# Patient Record
Sex: Male | Born: 1950
Health system: Southern US, Community
[De-identification: ages and names within clinical notes are randomized; demographics above are authoritative.]

## PROBLEM LIST (undated history)

## (undated) DIAGNOSIS — G5601 Carpal tunnel syndrome, right upper limb: Secondary | ICD-10-CM

## (undated) DIAGNOSIS — K219 Gastro-esophageal reflux disease without esophagitis: Secondary | ICD-10-CM

## (undated) DIAGNOSIS — K589 Irritable bowel syndrome without diarrhea: Secondary | ICD-10-CM

## (undated) DIAGNOSIS — I10 Essential (primary) hypertension: Secondary | ICD-10-CM

## (undated) DIAGNOSIS — G4730 Sleep apnea, unspecified: Secondary | ICD-10-CM

## (undated) DIAGNOSIS — H6690 Otitis media, unspecified, unspecified ear: Secondary | ICD-10-CM

## (undated) DIAGNOSIS — M1990 Unspecified osteoarthritis, unspecified site: Secondary | ICD-10-CM

## (undated) DIAGNOSIS — M797 Fibromyalgia: Secondary | ICD-10-CM

## (undated) DIAGNOSIS — E785 Hyperlipidemia, unspecified: Secondary | ICD-10-CM

## (undated) DIAGNOSIS — J302 Other seasonal allergic rhinitis: Secondary | ICD-10-CM

## (undated) DIAGNOSIS — E039 Hypothyroidism, unspecified: Secondary | ICD-10-CM

## (undated) DIAGNOSIS — R51 Headache: Secondary | ICD-10-CM

## (undated) HISTORY — PX: VASECTOMY: SHX75

## (undated) HISTORY — PX: KNEE ARTHROSCOPY: SUR90

## (undated) HISTORY — DX: Sleep apnea, unspecified: G47.30

## (undated) HISTORY — PX: CHOLECYSTECTOMY: SHX55

## (undated) HISTORY — DX: Hyperlipidemia, unspecified: E78.5

## (undated) HISTORY — PX: NASAL SEPTUM SURGERY: SHX37

---

## 2003-03-31 ENCOUNTER — Inpatient Hospital Stay (HOSPITAL_COMMUNITY): Admission: EM | Admit: 2003-03-31 | Discharge: 2003-04-05 | Payer: Self-pay | Admitting: *Deleted

## 2003-03-31 ENCOUNTER — Encounter: Payer: Self-pay | Admitting: Emergency Medicine

## 2004-04-16 IMAGING — CT CT CHEST W/ CM
1 of 2 series · 16 of 30 positions shown, 20 images · IV contrast (150 ML OMNI 300)
Comparison: none

CLINICAL DATA: Chest pain.  
 CHEST WITH CONTRAST
TECHNIQUE: Multidetector helical imaging was carried out utilizing PE protocol.  This consists of thin section helical imaging through the chest during high volume contrast infusion (150 cc Omnipaque 300).
 No pulmonary emboli are identified.  CT cannot reliably exclude small peripheral emboli, but the main pulmonary arteries, and their first and second order divisions are free of thrombi.    
 No mediastinal adenopathy.  No pleural or pericardial fluid.
 The lungs are clear except for some subsegmental atelectasis at the right base.  Minimal scarring at the left base.  No acute appearing air space disease.  
 No evidence for aortic dissection.
 IMPRESSION
 1.  No evidence for pulmonary embolism.
 2.  Lungs clear except for subsegmental atelectasis at the right base.

[Series 2: don't forget off/on recon (date) · axial · 0.70mm/px · z∈[-223,-28]mm · 16 of 176 slices shown, 20 images]
[im 10/176  mediastinal]
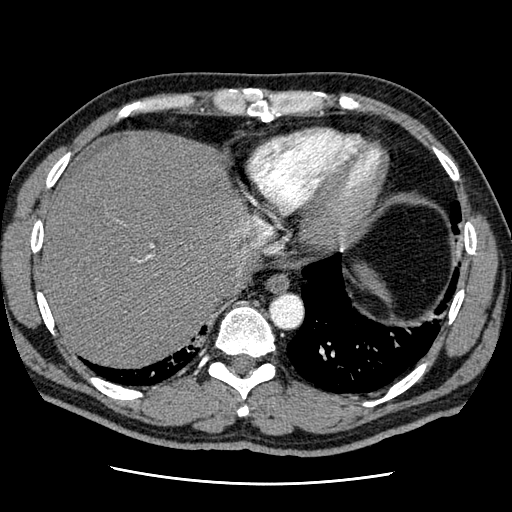
[im 10/176  lung]
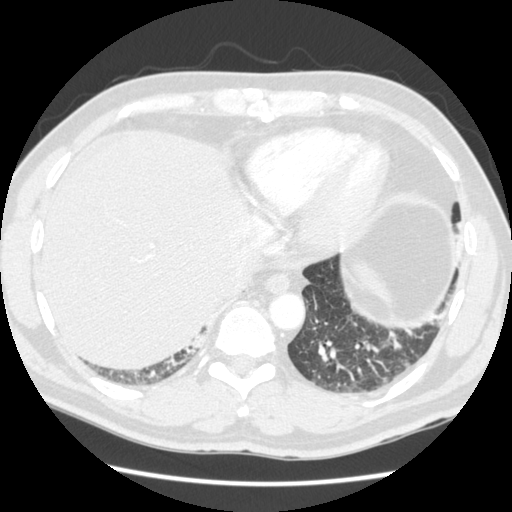
[im 20/176  lung]
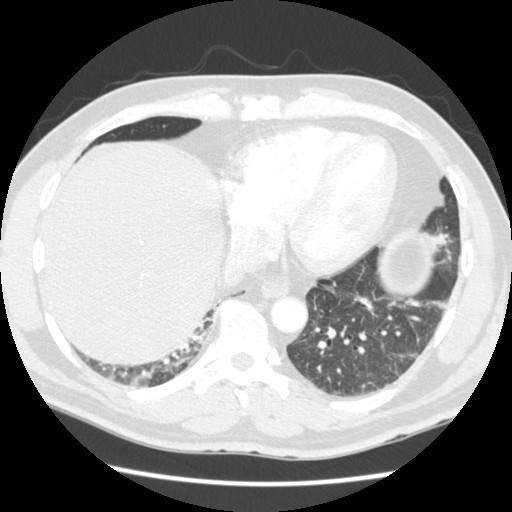
[im 30/176  lung]
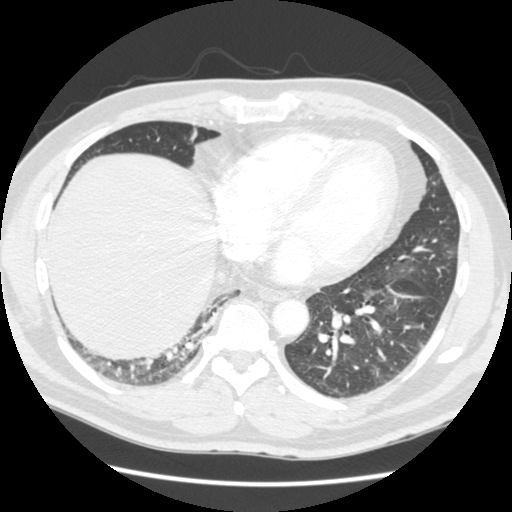
[im 39/176  lung]
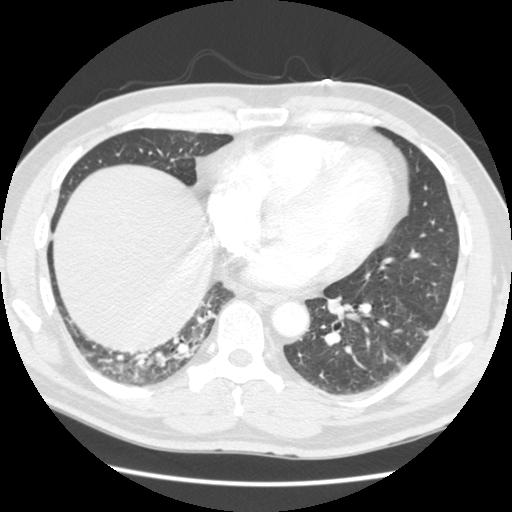
[im 59/176  mediastinal]
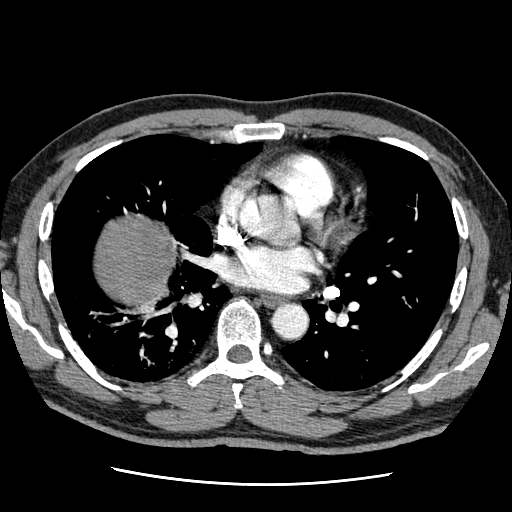
[im 59/176  lung]
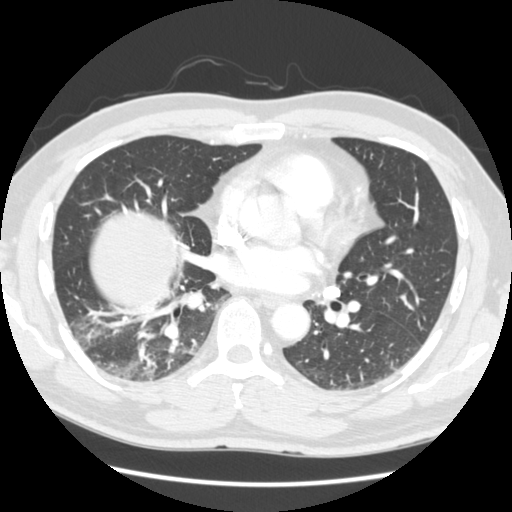
[im 69/176  lung]
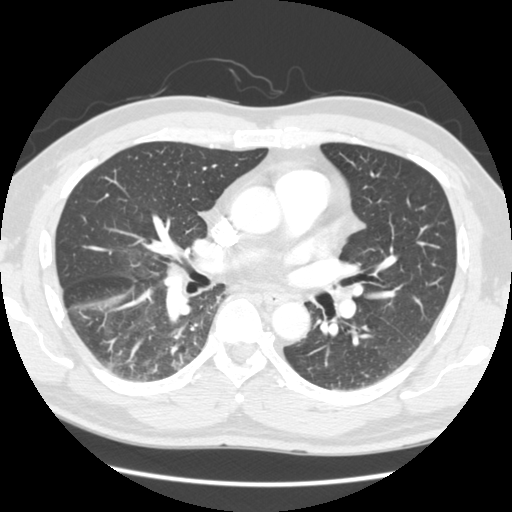
[im 78/176  lung]
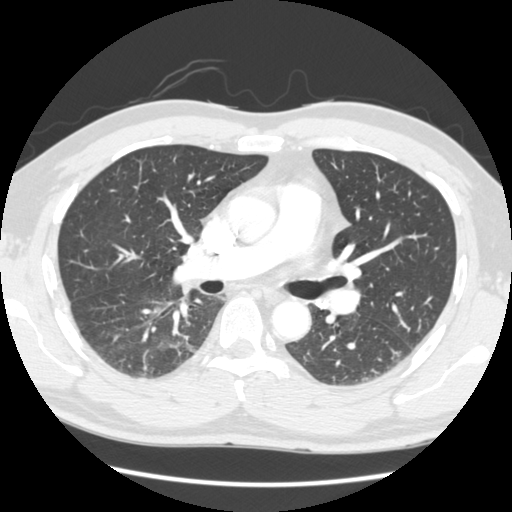
[im 81/176  lung]
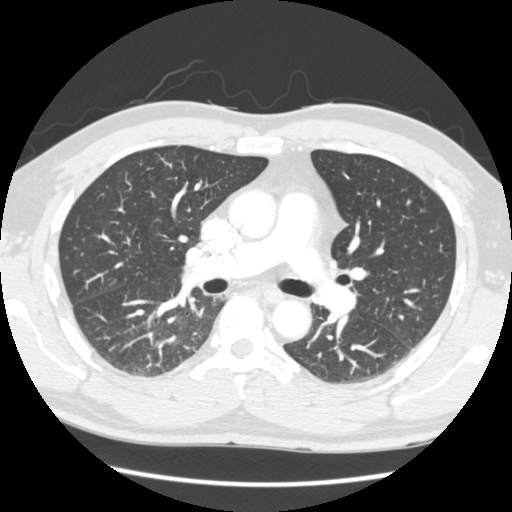
[im 88/176  mediastinal]
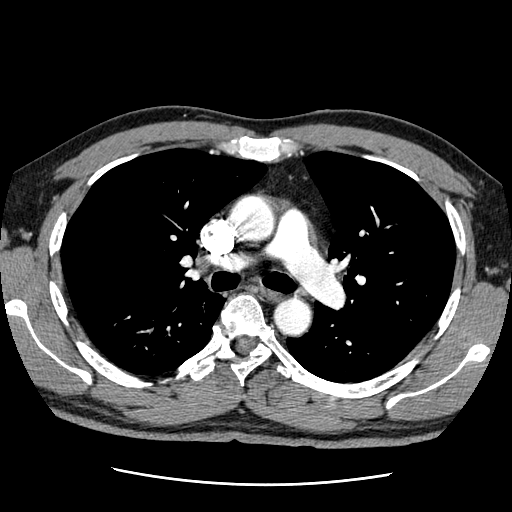
[im 88/176  lung]
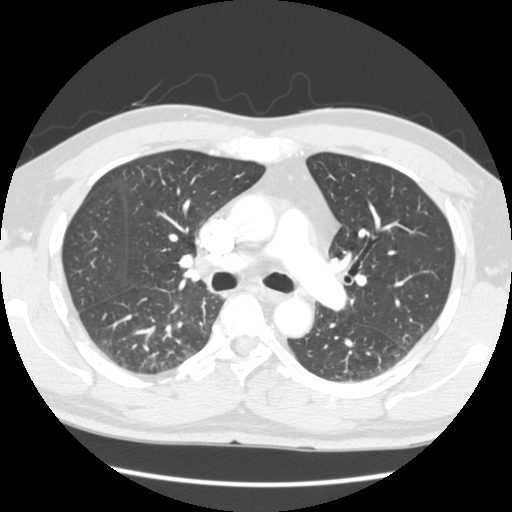
[im 98/176  lung]
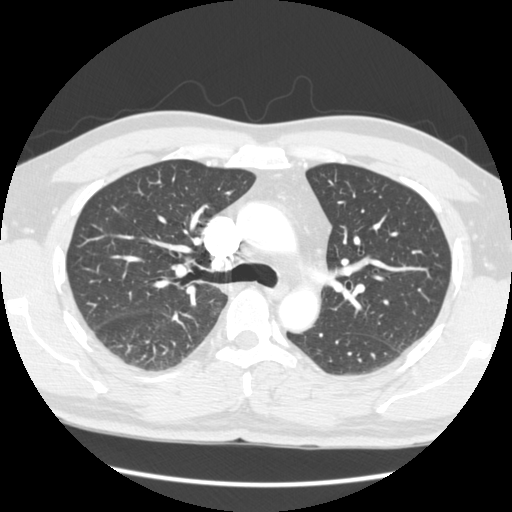
[im 107/176  lung]
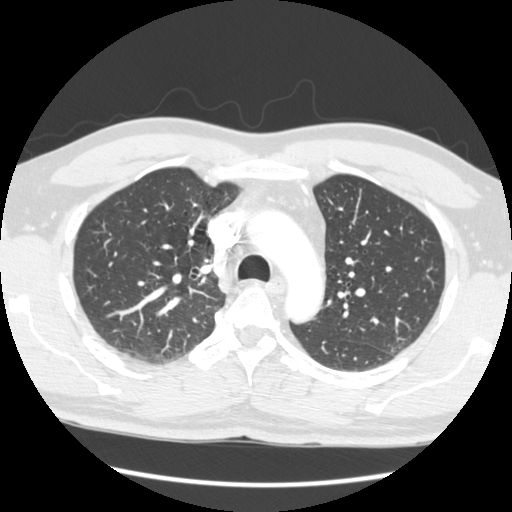
[im 117/176  lung]
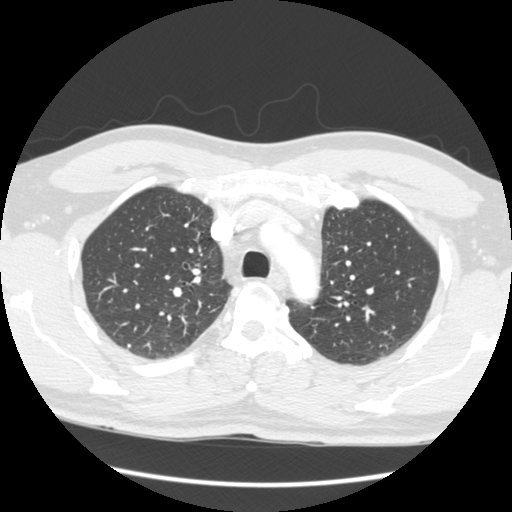
[im 137/176  mediastinal]
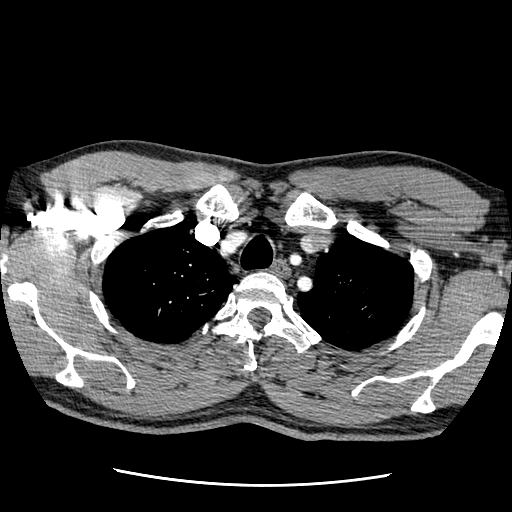
[im 137/176  lung]
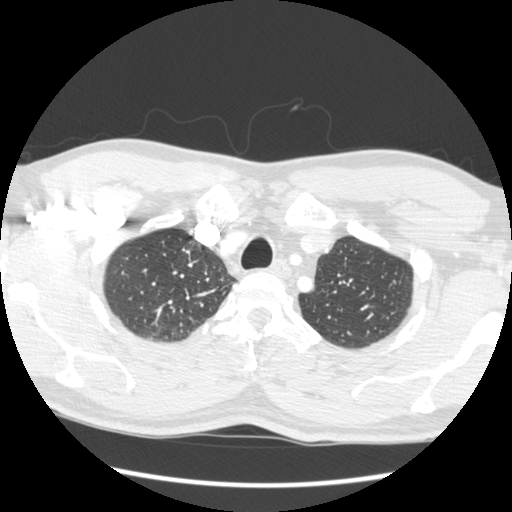
[im 146/176  lung]
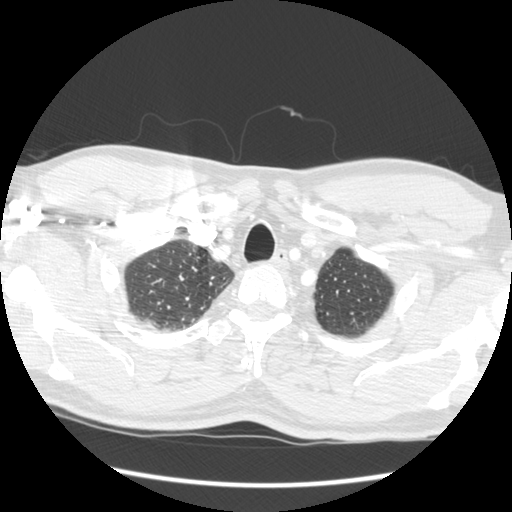
[im 156/176  lung]
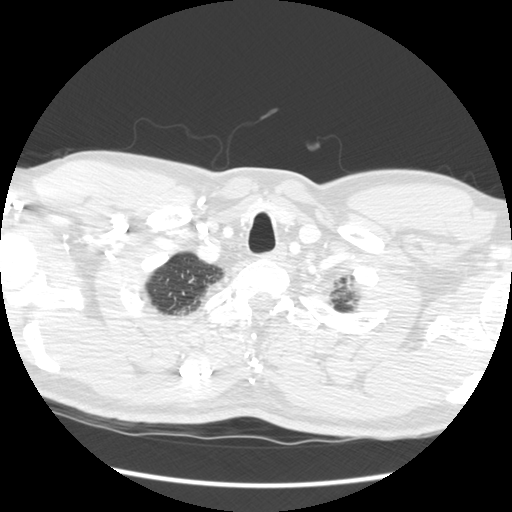
[im 166/176  lung]
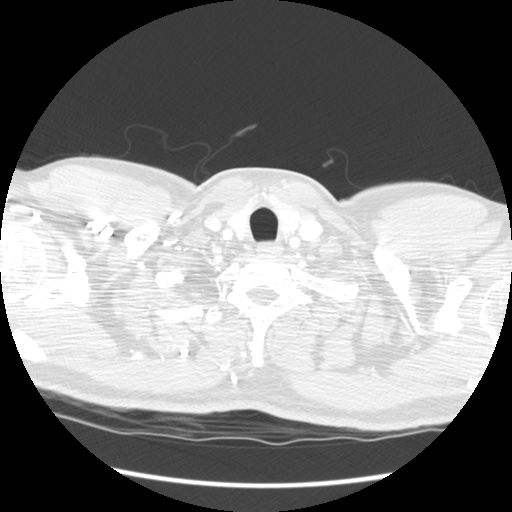

[16 of 30 positions shown; findings below may reference images not displayed]

## 2007-05-25 HISTORY — PX: CARDIAC CATHETERIZATION: SHX172

## 2011-01-08 ENCOUNTER — Ambulatory Visit (INDEPENDENT_AMBULATORY_CARE_PROVIDER_SITE_OTHER): Payer: BC Managed Care – PPO | Admitting: General Surgery

## 2011-01-08 ENCOUNTER — Encounter (INDEPENDENT_AMBULATORY_CARE_PROVIDER_SITE_OTHER): Payer: Self-pay | Admitting: General Surgery

## 2011-01-08 VITALS — BP 144/88 | HR 100 | Temp 97.9°F | Ht 70.0 in | Wt 227.6 lb

## 2011-01-08 DIAGNOSIS — K644 Residual hemorrhoidal skin tags: Secondary | ICD-10-CM

## 2011-01-08 NOTE — Progress Notes (Signed)
Subjective:   Bleeding hemorrhoid  Patient ID: [name], male   DOB: February 04, 1951, 60 y.o.   MRN: [id number]  HPI Patient is a 60 year old male who developed a swollen external hemorrhoid about 4 weeks ago. It has not been very painful. However, for about 2 weeks he has had daily bleeding that occasionally is very significant. He was seen by his PCP and referred for evaluation. He has not had a colonoscopy. He has had previous thrombosed external hemorrhoids lanced in the past.  Review of Systems  Respiratory: Negative.   Cardiovascular: Negative.   Gastrointestinal: Positive for blood in stool. Negative for abdominal pain and rectal pain.  Musculoskeletal: Positive for myalgias.       Objective:   Physical Exam General: Well-developed in no distress Skin: Warm and dry Lungs: Clear without increased work of breathing Cardiovascular regular rate and rhythm. No edema. Rectal: There is a large approximately 2-3 cm right posterior thrombosed hemorrhoid with some overlying skin breakdown and draining old blood. There is a very small adjacent noninflamed thrombosed hemorrhoid.   Assessment:     Large thrombosed external hemorrhoid that has been present for several weeks and now has some skin breakdown and ongoing bleeding.    Plan:     We discussed alternatives of continued medication versus excision. I think he would have a much quicker recovery with excision and this is what he desired.  Under local anesthesia with Betadine prep the entire thrombosed hemorrhoid was elliptically excised. The wound was closed with a 3-0 chromic suture. Complete hemostasis was obtained.  He was given a prescription for topical anesthetic and pain medication. Wound care was discussed. He will return as needed.

## 2011-01-08 NOTE — Patient Instructions (Signed)
Clean the area several times per day in the tub or shower. Expect some bleeding. Call for any concerns.

## 2012-05-24 HISTORY — PX: COLONOSCOPY: SHX174

## 2012-09-29 ENCOUNTER — Other Ambulatory Visit: Payer: Self-pay | Admitting: Family Medicine

## 2012-09-29 ENCOUNTER — Ambulatory Visit
Admission: RE | Admit: 2012-09-29 | Discharge: 2012-09-29 | Disposition: A | Discharge status: Home / Self Care | Payer: BC Managed Care – PPO | Source: Ambulatory Visit | Attending: Family Medicine | Admitting: Family Medicine

## 2012-09-29 DIAGNOSIS — M25532 Pain in left wrist: Secondary | ICD-10-CM

## 2012-11-14 ENCOUNTER — Other Ambulatory Visit: Payer: Self-pay | Admitting: Orthopedic Surgery

## 2012-11-21 ENCOUNTER — Encounter (HOSPITAL_BASED_OUTPATIENT_CLINIC_OR_DEPARTMENT_OTHER): Payer: Self-pay | Admitting: *Deleted

## 2012-11-21 NOTE — Pre-Procedure Instructions (Signed)
To come for BMET and EKG 

## 2012-11-27 ENCOUNTER — Encounter (HOSPITAL_BASED_OUTPATIENT_CLINIC_OR_DEPARTMENT_OTHER)
Admission: RE | Admit: 2012-11-27 | Discharge: 2012-11-27 | Disposition: A | Discharge status: Home / Self Care | Payer: BC Managed Care – PPO | Source: Ambulatory Visit | Attending: Orthopedic Surgery | Admitting: Orthopedic Surgery

## 2012-11-27 ENCOUNTER — Other Ambulatory Visit: Payer: Self-pay

## 2012-11-27 LAB — BASIC METABOLIC PANEL
BUN: 15 mg/dL (ref 6–23)
Calcium: 9.2 mg/dL (ref 8.4–10.5)
GFR calc non Af Amer: 88 mL/min — ABNORMAL LOW (ref >90)
Glucose, Bld: 105 mg/dL — ABNORMAL HIGH (ref 70–99)
Potassium: 4.2 mEq/L (ref 3.5–5.1)

## 2012-11-27 NOTE — H&P (Signed)
  [name] is an 62 y.o. male.   Chief Complaint: c/o chronic and progressive numbness and tingling of the left hand HPI: [name] is seen for a consult regarding his significant hand discomfort and numbness. He has numbness in his hands when he drives. He awakens at night typically with numbness in the median innervated fingers. He now seeks an upper extremity orthopaedic consult.    Past Medical History  Diagnosis Date  . Hyperlipidemia   . Sleep apnea   . Seasonal allergies   . Irritable bowel syndrome     no current med.  . Fibromyalgia   . Carpal tunnel syndrome of left wrist 11/2012  . Hypertension     under control with meds., has been on med. since 2008  . Headache(784.0)     sinus headaches; also dull headache almost every day, states related to fibromyalgia    Past Surgical History  Procedure Laterality Date  . Cholecystectomy    . Knee arthroscopy Bilateral   . Nasal septum surgery    . Vasectomy      Family History  Problem Relation Age of Onset  . Cancer Mother     bladder  . Hypertension Mother   . Heart disease Father   . Hypertension Sister   . Heart disease Sister    Social History:  reports that he has quit smoking. He has never used smokeless tobacco. He reports that he does not drink alcohol or use illicit drugs.  Allergies: No Known Allergies  No prescriptions prior to admission    No results found for this or any previous visit (from the past 48 hour(s)).  No results found.   Pertinent items are noted in HPI.  Height 5\' 10"  (1.778 m), weight 104.327 kg (230 lb).  General appearance: alert Head: Normocephalic, without obvious abnormality Neck: supple, symmetrical, trachea midline Resp: clear to auscultation bilaterally Cardio: regular rate and rhythm GI: normal findings: bowel sounds normal Extremities:. Inspection of his hands reveals no muscle atrophy or deformity. He has wrist ROM palmar flexion right 80, left 80,  dorsiflexion right 80, left 80, symmetrical radial and ulnar deviation. He has a positive wrist flexion test at 1 minute reproducing numbness in his hands, left before right. His pulses and capillary refill are intact. He has no sign of stenosing tenosynovitis of his fingers, thumb or first dorsal compartment.   He had x-rays of his left hand and wrist obtained at [facility] on 09-29-12. These were interpreted by Dr. Ova Freshwater to reveal no fracture or acute bony abnormalities.  He is referred to see Dr. Wadie Lessen for electrodiagnostic studies. These document bilateral carpal tunnel syndrome left worse than right.   Pulses: 2+ and symmetric Skin: normal Neurologic: Grossly normal    Assessment/Plan Impression: Left CTS  Plan: To the OR for left CTR.The procedure, risks,benefits and post-op course were discussed with the patient at length and they were in agreement with the plan.  [name],Geffrey Michaelsen J 11/27/2012, 1:32 PM   H&P documentation: 11/28/2012  -History and Physical Reviewed  -Patient has been re-examined  -No change in the plan of care  Wyn Forster, MD

## 2012-11-28 ENCOUNTER — Ambulatory Visit (HOSPITAL_BASED_OUTPATIENT_CLINIC_OR_DEPARTMENT_OTHER): Payer: BC Managed Care – PPO | Admitting: Anesthesiology

## 2012-11-28 ENCOUNTER — Encounter (HOSPITAL_BASED_OUTPATIENT_CLINIC_OR_DEPARTMENT_OTHER): Payer: Self-pay | Admitting: *Deleted

## 2012-11-28 ENCOUNTER — Encounter (HOSPITAL_BASED_OUTPATIENT_CLINIC_OR_DEPARTMENT_OTHER)
Admission: RE | Disposition: A | Discharge status: Home / Self Care | Payer: Self-pay | Source: Ambulatory Visit | Attending: Orthopedic Surgery

## 2012-11-28 ENCOUNTER — Encounter (HOSPITAL_BASED_OUTPATIENT_CLINIC_OR_DEPARTMENT_OTHER): Payer: Self-pay | Admitting: Anesthesiology

## 2012-11-28 ENCOUNTER — Ambulatory Visit (HOSPITAL_BASED_OUTPATIENT_CLINIC_OR_DEPARTMENT_OTHER)
Admission: RE | Admit: 2012-11-28 | Discharge: 2012-11-28 | Disposition: A | Discharge status: Home / Self Care | Payer: BC Managed Care – PPO | Source: Ambulatory Visit | Attending: Orthopedic Surgery | Admitting: Orthopedic Surgery

## 2012-11-28 DIAGNOSIS — I10 Essential (primary) hypertension: Secondary | ICD-10-CM | POA: Insufficient documentation

## 2012-11-28 DIAGNOSIS — G4730 Sleep apnea, unspecified: Secondary | ICD-10-CM | POA: Insufficient documentation

## 2012-11-28 DIAGNOSIS — IMO0001 Reserved for inherently not codable concepts without codable children: Secondary | ICD-10-CM | POA: Insufficient documentation

## 2012-11-28 DIAGNOSIS — Z87891 Personal history of nicotine dependence: Secondary | ICD-10-CM | POA: Insufficient documentation

## 2012-11-28 DIAGNOSIS — G5600 Carpal tunnel syndrome, unspecified upper limb: Secondary | ICD-10-CM | POA: Insufficient documentation

## 2012-11-28 DIAGNOSIS — J309 Allergic rhinitis, unspecified: Secondary | ICD-10-CM | POA: Insufficient documentation

## 2012-11-28 DIAGNOSIS — E785 Hyperlipidemia, unspecified: Secondary | ICD-10-CM | POA: Insufficient documentation

## 2012-11-28 HISTORY — DX: Headache: R51

## 2012-11-28 HISTORY — PX: CARPAL TUNNEL RELEASE: SHX101

## 2012-11-28 HISTORY — DX: Irritable bowel syndrome without diarrhea: K58.9

## 2012-11-28 HISTORY — DX: Fibromyalgia: M79.7

## 2012-11-28 HISTORY — DX: Essential (primary) hypertension: I10

## 2012-11-28 HISTORY — DX: Other seasonal allergic rhinitis: J30.2

## 2012-11-28 SURGERY — CARPAL TUNNEL RELEASE
Anesthesia: General | Site: Wrist | Laterality: Left | Wound class: Clean

## 2012-11-28 MED ORDER — MIDAZOLAM HCL 2 MG/ML PO SYRP: 12.0000 mg | ORAL_SOLUTION | Freq: Once | ORAL | Status: DC | PRN

## 2012-11-28 MED ORDER — OXYCODONE HCL 5 MG PO TABS
5.0000 mg | ORAL_TABLET | Freq: Once | ORAL | Status: AC | PRN
  Administered 2012-11-28: 5 mg via ORAL

## 2012-11-28 MED ORDER — LIDOCAINE HCL 2 % IJ SOLN
INTRAMUSCULAR | Status: DC | PRN
  Administered 2012-11-28: 3 mL

## 2012-11-28 MED ORDER — METOCLOPRAMIDE HCL 5 MG/ML IJ SOLN: 10.0000 mg | Freq: Once | INTRAMUSCULAR | Status: DC | PRN

## 2012-11-28 MED ORDER — CHLORHEXIDINE GLUCONATE 4 % EX LIQD: 60.0000 mL | Freq: Once | CUTANEOUS | Status: DC

## 2012-11-28 MED ORDER — MIDAZOLAM HCL 5 MG/5ML IJ SOLN
INTRAMUSCULAR | Status: DC | PRN
  Administered 2012-11-28: 2 mg via INTRAVENOUS

## 2012-11-28 MED ORDER — PROPOFOL 10 MG/ML IV BOLUS
INTRAVENOUS | Status: DC | PRN
  Administered 2012-11-28: 200 mg via INTRAVENOUS

## 2012-11-28 MED ORDER — FENTANYL CITRATE 0.05 MG/ML IJ SOLN: 50.0000 ug | INTRAMUSCULAR | Status: DC | PRN

## 2012-11-28 MED ORDER — FENTANYL CITRATE 0.05 MG/ML IJ SOLN: 25.0000 ug | INTRAMUSCULAR | Status: DC | PRN

## 2012-11-28 MED ORDER — MIDAZOLAM HCL 2 MG/2ML IJ SOLN: 1.0000 mg | INTRAMUSCULAR | Status: DC | PRN

## 2012-11-28 MED ORDER — ONDANSETRON HCL 4 MG/2ML IJ SOLN
INTRAMUSCULAR | Status: DC | PRN
  Administered 2012-11-28: 4 mg via INTRAVENOUS

## 2012-11-28 MED ORDER — OXYCODONE HCL 5 MG/5ML PO SOLN: 5.0000 mg | Freq: Once | ORAL | Status: AC | PRN

## 2012-11-28 MED ORDER — FENTANYL CITRATE 0.05 MG/ML IJ SOLN
INTRAMUSCULAR | Status: DC | PRN
  Administered 2012-11-28: 100 ug via INTRAVENOUS

## 2012-11-28 MED ORDER — LIDOCAINE HCL (CARDIAC) 20 MG/ML IV SOLN
INTRAVENOUS | Status: DC | PRN
  Administered 2012-11-28: 100 mg via INTRAVENOUS

## 2012-11-28 MED ORDER — LACTATED RINGERS IV SOLN
INTRAVENOUS | Status: DC
  Administered 2012-11-28: 08:00:00 via INTRAVENOUS

## 2012-11-28 MED ORDER — DEXAMETHASONE SODIUM PHOSPHATE 4 MG/ML IJ SOLN
INTRAMUSCULAR | Status: DC | PRN
  Administered 2012-11-28: 10 mg via INTRAVENOUS

## 2012-11-28 MED ORDER — HYDROMORPHONE HCL 2 MG PO TABS: 2.0000 mg | ORAL_TABLET | ORAL | Status: DC | PRN

## 2012-11-28 SURGICAL SUPPLY — 41 items
BANDAGE ADHESIVE 1X3 (GAUZE/BANDAGES/DRESSINGS) IMPLANT
BANDAGE ELASTIC 3 VELCRO ST LF (GAUZE/BANDAGES/DRESSINGS) ×2 IMPLANT
BLADE SURG 15 STRL LF DISP TIS (BLADE) ×1 IMPLANT
BLADE SURG 15 STRL SS (BLADE) ×1
BNDG ESMARK 4X9 LF (GAUZE/BANDAGES/DRESSINGS) ×2 IMPLANT
BRUSH SCRUB EZ PLAIN DRY (MISCELLANEOUS) ×2 IMPLANT
CLOTH BEACON ORANGE TIMEOUT ST (SAFETY) ×2 IMPLANT
CORDS BIPOLAR (ELECTRODE) ×2 IMPLANT
COVER MAYO STAND STRL (DRAPES) ×2 IMPLANT
COVER TABLE BACK 60X90 (DRAPES) ×2 IMPLANT
CUFF TOURNIQUET SINGLE 18IN (TOURNIQUET CUFF) ×2 IMPLANT
DECANTER SPIKE VIAL GLASS SM (MISCELLANEOUS) IMPLANT
DRAPE EXTREMITY T 121X128X90 (DRAPE) ×2 IMPLANT
DRAPE SURG 17X23 STRL (DRAPES) ×2 IMPLANT
GLOVE BIO SURGEON STRL SZ 6.5 (GLOVE) ×2 IMPLANT
GLOVE BIOGEL M STRL SZ7.5 (GLOVE) IMPLANT
GLOVE BIOGEL PI IND STRL 6.5 (GLOVE) ×1 IMPLANT
GLOVE BIOGEL PI INDICATOR 6.5 (GLOVE) ×1
GLOVE EXAM NITRILE EXT CUFF MD (GLOVE) ×2 IMPLANT
GLOVE ORTHO TXT STRL SZ7.5 (GLOVE) ×2 IMPLANT
GLOVE SURG SS PI 7.0 STRL IVOR (GLOVE) ×2 IMPLANT
GOWN BRE IMP PREV XXLGXLNG (GOWN DISPOSABLE) ×2 IMPLANT
GOWN PREVENTION PLUS XLARGE (GOWN DISPOSABLE) ×4 IMPLANT
NDL SAFETY ECLIPSE 18X1.5 (NEEDLE) ×1 IMPLANT
NEEDLE 27GAX1X1/2 (NEEDLE) ×2 IMPLANT
NEEDLE HYPO 18GX1.5 SHARP (NEEDLE) ×1
PACK BASIN DAY SURGERY FS (CUSTOM PROCEDURE TRAY) ×2 IMPLANT
PAD CAST 3X4 CTTN HI CHSV (CAST SUPPLIES) ×1 IMPLANT
PADDING CAST ABS 4INX4YD NS (CAST SUPPLIES)
PADDING CAST ABS COTTON 4X4 ST (CAST SUPPLIES) IMPLANT
PADDING CAST COTTON 3X4 STRL (CAST SUPPLIES) ×1
SPLINT PLASTER CAST XFAST 3X15 (CAST SUPPLIES) ×5 IMPLANT
SPLINT PLASTER XTRA FASTSET 3X (CAST SUPPLIES) ×5
SPONGE GAUZE 4X4 12PLY (GAUZE/BANDAGES/DRESSINGS) IMPLANT
STOCKINETTE 4X48 STRL (DRAPES) ×2 IMPLANT
STRIP CLOSURE SKIN 1/2X4 (GAUZE/BANDAGES/DRESSINGS) ×2 IMPLANT
SUT PROLENE 3 0 PS 2 (SUTURE) ×2 IMPLANT
SYR 3ML 23GX1 SAFETY (SYRINGE) IMPLANT
SYR CONTROL 10ML LL (SYRINGE) ×2 IMPLANT
TRAY DSU PREP LF (CUSTOM PROCEDURE TRAY) ×2 IMPLANT
UNDERPAD 30X30 INCONTINENT (UNDERPADS AND DIAPERS) ×2 IMPLANT

## 2012-11-28 NOTE — Anesthesia Preprocedure Evaluation (Signed)
Anesthesia Evaluation  Patient identified by MRN, date of birth, ID band Patient awake    Reviewed: Allergy & Precautions, H&P , NPO status , Patient's Chart, lab work & pertinent test results, reviewed documented beta blocker date and time   Airway Mallampati: II TM Distance: >3 FB Neck ROM: full    Dental   Pulmonary sleep apnea and Continuous Positive Airway Pressure Ventilation ,  breath sounds clear to auscultation        Cardiovascular hypertension, On Medications Rhythm:regular     Neuro/Psych  Headaches,  Neuromuscular disease negative psych ROS   GI/Hepatic negative GI ROS, Neg liver ROS,   Endo/Other  negative endocrine ROS  Renal/GU negative Renal ROS  negative genitourinary   Musculoskeletal   Abdominal   Peds  Hematology negative hematology ROS (+)   Anesthesia Other Findings See surgeon's H&P   Reproductive/Obstetrics negative OB ROS                           Anesthesia Physical Anesthesia Plan  ASA: II  Anesthesia Plan: General   Post-op Pain Management:    Induction: Intravenous  Airway Management Planned: LMA  Additional Equipment:   Intra-op Plan:   Post-operative Plan:   Informed Consent: I have reviewed the patients History and Physical, chart, labs and discussed the procedure including the risks, benefits and alternatives for the proposed anesthesia with the patient or authorized representative who has indicated his/her understanding and acceptance.   Dental Advisory Given  Plan Discussed with: CRNA and Surgeon  Anesthesia Plan Comments:         Anesthesia Quick Evaluation

## 2012-11-28 NOTE — Brief Op Note (Signed)
11/28/2012  8:59 AM  PATIENT:  [name]  62 y.o. male  PRE-OPERATIVE DIAGNOSIS:  LEFT CARPAL TUNNEL SYNDROME  POST-OPERATIVE DIAGNOSIS:  LEFT CARPAL TUNNEL SYNDROME  PROCEDURE:  Procedure(s): CARPAL TUNNEL RELEASE (Left)  SURGEON:  Surgeon(s) and Role:    * Wyn Forster., MD - Primary  PHYSICIAN ASSISTANT:   ASSISTANTS: Surgical technician   ANESTHESIA:   general  EBL:  Total I/O In: 200 [I.V.:200] Out: -   BLOOD ADMINISTERED:none  DRAINS: none   LOCAL MEDICATIONS USED:  XYLOCAINE   SPECIMEN:  No Specimen  DISPOSITION OF SPECIMEN:  N/A  COUNTS:  YES  TOURNIQUET:   Total Tourniquet Time Documented: Upper Arm (Left) - 11 minutes Total: Upper Arm (Left) - 11 minutes   DICTATION: .Other Dictation: Dictation Number dictated  PLAN OF CARE: Discharge to home after PACU  PATIENT DISPOSITION:  PACU - hemodynamically stable.   Delay start of Pharmacological VTE agent (>24hrs) due to surgical blood loss or risk of bleeding: not applicable

## 2012-11-28 NOTE — Anesthesia Procedure Notes (Signed)
Procedure Name: LMA Insertion Date/Time: 11/28/2012 8:35 AM Performed by: Verlan Friends Pre-anesthesia Checklist: Patient identified, Emergency Drugs available, Suction available, Patient being monitored and Timeout performed Patient Re-evaluated:Patient Re-evaluated prior to inductionOxygen Delivery Method: Circle System Utilized Preoxygenation: Pre-oxygenation with 100% oxygen Intubation Type: IV induction Ventilation: Mask ventilation without difficulty LMA: LMA inserted LMA Size: 5.0 Number of attempts: 1 Placement Confirmation: positive ETCO2 Tube secured with: Tape Dental Injury: Teeth and Oropharynx as per pre-operative assessment

## 2012-11-28 NOTE — Op Note (Signed)
[name], ROUTE              ACCOUNT NO.:  [id number]  MEDICAL RECORD NO.:  [id number]  LOCATION:                               FACILITY:  [facility]  PHYSICIAN:  Katy Fitch. Lynk Marti, M.D. DATE OF BIRTH:  1950/11/30  DATE OF PROCEDURE:  11/28/2012 DATE OF DISCHARGE:  11/28/2012                              OPERATIVE REPORT   PREOPERATIVE DIAGNOSIS:  Chronic pain left hand due to median entrapment neuropathy.  POSTOPERATIVE DIAGNOSIS:  Chronic pain left hand due to median entrapment neuropathy.  OPERATION:  Release of left transverse carpal ligament.  OPERATING SURGEON:  Katy Fitch. Sanford Lindblad, MD  ASSISTANT:  Surgical technician.  ANESTHESIA:  General by LMA.  SUPERVISING ANESTHESIOLOGIST:  Dr. Gelene Mink.  INDICATIONS:  [name] is a 62 year old gentleman referred through the courtesy of Dr. Tally Joe of [facility] for evaluation and management of bilateral hand pain and numbness.  Clinical examination revealed a gentleman with chronic pain issues including fibromyalgia and hand numbness.  Clinical examination suggested bilateral carpal tunnel syndrome and electrodiagnostic studies confirmed bilateral carpal tunnel syndrome.  Mr. Mahon' numbness did not respond to splinting, activity modification, and steroid injection, therefore, we recommended proceeding with release of left transverse carpal ligament.  After informed consent, he was brought to the operating room at this time.  PROCEDURE:  [name] was interviewed in the holding area by Dr. Justin Mend and after detailed anesthesia informed consent was advised to undergo general anesthesia by LMA technique.  This was accepted by [name].  His proper surgical site was identified.  His hand examined to be sure it did not have signs of stenosing tenosynovitis in addition to his carpal tunnel syndrome.  His left hand and wrist were marked with a marking pen per protocol.  He was then  transferred to room 6 of [facility], where under Dr. Thornton Dales direct supervision, general anesthesia by LMA technique was induced.  The left hand and arm were then prepped with Betadine soap and solution, sterilely draped.  A pneumatic tourniquet was applied to the proximal left brachium.  Following a routine surgical time-out, the left arm and hand were exsanguinated with Esmarch bandage and the arterial tourniquet inflated to 220 mmHg.  Procedure commenced with a short incision in the line of the ring finger and the palm.  Subcutaneous tissues were carefully divided revealing the palmar fascia.  This was split longitudinally to reveal the common sensory branch of the median nerve.  The common sensory branches were followed back to the transverse carpal ligament and the median nerve proper separated from the transverse carpal ligament with a Penfield 4 Engineer, structural.  The transverse carpal ligament was released along its ulnar border extending into the distal forearm.  This widely opened the carpal canal. No mass or other predicaments were noted.  Bleeding points along the margin of the released ligament were electrocauterized with bipolar forceps.  Carpal canal was widely opened.  The ulnar bursa was thickened and fibrotic.  No masses or other predicaments were noted.  Bleeding points along the margin of the released ligament were electrocauterized with bipolar current followed by repair of the skin with intradermal 3-0 Prolene suture.  A  Steri-Strips applied followed by infiltration of wound with 3 mL of 2% lidocaine for postoperative comfort.  The hand and wrist were then placed in a compressive dressing of sterile gauze, sterile Webril, and a volar plaster splint maintaining the wrist in 15 degrees of dorsiflexion.  For aftercare, Mr. Jost was provided prescription for Dilaudid 2 mg 1 p.o. q.4-6 hours p.r.n. pain, 20 tablets without refill.     Katy Fitch  Robin Petrakis, M.D.     RVS/MEDQ  D:  11/28/2012  T:  11/28/2012  Job:  [id number]

## 2012-11-28 NOTE — Transfer of Care (Signed)
Immediate Anesthesia Transfer of Care Note  Patient: [name]  Procedure(s) Performed: Procedure(s): CARPAL TUNNEL RELEASE (Left)  Patient Location: PACU  Anesthesia Type:General  Level of Consciousness: sedated and unresponsive  Airway & Oxygen Therapy: Patient Spontanous Breathing and Patient connected to face mask oxygen  Post-op Assessment: Report given to PACU RN and Post -op Vital signs reviewed and stable  Post vital signs: Reviewed and stable  Complications: No apparent anesthesia complications

## 2012-11-28 NOTE — Op Note (Signed)
dictated

## 2012-11-28 NOTE — Anesthesia Postprocedure Evaluation (Signed)
Anesthesia Post Note  Patient: [name]  Procedure(s) Performed: Procedure(s) (LRB): CARPAL TUNNEL RELEASE (Left)  Anesthesia type: MAC  Patient location: PACU  Post pain: Pain level controlled  Post assessment: Patient's Cardiovascular Status Stable  Last Vitals:  Filed Vitals:   11/28/12 0945  BP: 145/82  Pulse: 73  Temp:   Resp: 12    Post vital signs: Reviewed and stable  Level of consciousness: alert  Complications: No apparent anesthesia complications

## 2012-11-29 ENCOUNTER — Encounter (HOSPITAL_BASED_OUTPATIENT_CLINIC_OR_DEPARTMENT_OTHER): Payer: Self-pay | Admitting: Orthopedic Surgery

## 2012-12-22 DIAGNOSIS — G5601 Carpal tunnel syndrome, right upper limb: Secondary | ICD-10-CM

## 2012-12-22 HISTORY — DX: Carpal tunnel syndrome, right upper limb: G56.01

## 2012-12-25 DIAGNOSIS — H6690 Otitis media, unspecified, unspecified ear: Secondary | ICD-10-CM

## 2012-12-25 HISTORY — DX: Otitis media, unspecified, unspecified ear: H66.90

## 2012-12-26 ENCOUNTER — Other Ambulatory Visit: Payer: Self-pay | Admitting: Orthopedic Surgery

## 2012-12-26 ENCOUNTER — Encounter (HOSPITAL_BASED_OUTPATIENT_CLINIC_OR_DEPARTMENT_OTHER): Payer: Self-pay | Admitting: *Deleted

## 2012-12-26 NOTE — Pre-Procedure Instructions (Signed)
To come for BMET 

## 2013-01-01 ENCOUNTER — Encounter (HOSPITAL_BASED_OUTPATIENT_CLINIC_OR_DEPARTMENT_OTHER)
Admission: RE | Admit: 2013-01-01 | Discharge: 2013-01-01 | Disposition: A | Discharge status: Home / Self Care | Payer: BC Managed Care – PPO | Source: Ambulatory Visit | Attending: Orthopedic Surgery | Admitting: Orthopedic Surgery

## 2013-01-01 LAB — BASIC METABOLIC PANEL
BUN: 11 mg/dL (ref 6–23)
CO2: 29 mEq/L (ref 19–32)
Chloride: 101 mEq/L (ref 96–112)
GFR calc non Af Amer: 79 mL/min — ABNORMAL LOW (ref >90)
Glucose, Bld: 96 mg/dL (ref 70–99)
Potassium: 4.5 mEq/L (ref 3.5–5.1)
Sodium: 141 mEq/L (ref 135–145)

## 2013-01-01 NOTE — H&P (Signed)
[name] is an 62 y.o. male.   Chief Complaint: c/o chronic and progressive numbness and tingling of the right hand HPI: [name] is seen for a consult regarding his significant hand discomfort and numbness. He has sleep apnea and chronic Fibromyalgia. He has seen multiple rheumatologists including Drs. Rowe, Zieminski and [employer]. He has been on medication for Fibromyalgia for multiple years. He continues to have enthesopathy type pains. He has significant difficulty sleeping despite use of a CPAP machine. With pain medication and a CPAP he sleeps 3-4 hours maximum. He has numbness in his hands when he drives. He awakens at night typically with numbness in the median innervated fingers. He now seeks an upper extremity orthopaedic consult.   Past Medical History  Diagnosis Date  . Hyperlipidemia   . Seasonal allergies   . Irritable bowel syndrome     no current med.  . Hypertension     under control with meds., has been on med. since 2008  . Headache(784.0)     sinus headaches; also dull headache almost every day, states related to fibromyalgia  . Sleep apnea     uses CPAP nightly  . Fibromyalgia   . Carpal tunnel syndrome of right wrist 12/2012  . Otitis media 12/25/2012    started antibiotic 12/25/2012 x 14 days    Past Surgical History  Procedure Laterality Date  . Cholecystectomy    . Knee arthroscopy Bilateral   . Nasal septum surgery    . Vasectomy    . Carpal tunnel release Left 11/28/2012    Procedure: CARPAL TUNNEL RELEASE;  Surgeon: Wyn Forster., MD;  Location: [facility];  Service: Orthopedics;  Laterality: Left;    Family History  Problem Relation Age of Onset  . Cancer Mother     bladder  . Hypertension Mother   . Heart disease Father   . Hypertension Sister   . Heart disease Sister    Social History:  reports that he has quit smoking. He has never used smokeless tobacco. He reports that he does not drink alcohol or use illicit  drugs.  Allergies: No Known Allergies  No prescriptions prior to admission    No results found for this or any previous visit (from the past 48 hour(s)).  No results found.   Pertinent items are noted in HPI.  Height 5\' 10"  (1.778 m), weight 105.235 kg (232 lb).  General appearance: alert Head: Normocephalic, without obvious abnormality Neck: supple, symmetrical, trachea midline Resp: clear to auscultation bilaterally Cardio: regular rate and rhythm GI: normal findings: bowel sounds normal Extremities: Inspection of his hands reveals no muscle atrophy or deformity. He has wrist ROM palmar flexion right 80, left 80, dorsiflexion right 80, left 80, symmetrical radial and ulnar deviation. He has a positive wrist flexion test at 1 minute reproducing numbness in his hands, left before right. His pulses and capillary refill are intact. He has no sign of stenosing tenosynovitis of his fingers, thumb or first dorsal compartment.   He had x-rays of his left hand and wrist obtained at [facility] on 09-29-12. These were interpreted by Dr. Ova Freshwater to reveal no fracture or acute bony abnormalities.  He is referred to see Dr.Sam Pelligra for electrodiagnostic studies. These document bilateral carpal tunnel syndrome left worse than right.     Pulses: 2+ and symmetric Skin: normal Neurologic: Grossly normal   Assessment/Plan Impression: Right CTS  Plan: TO the OR for right CTR.The procedure, risks,benefits and post-op  course were discussed with the patient at length and they were in agreement with the plan.  [name],[name] 01/01/2013, 2:19 PM   H&P documentation: 01/02/2013  -History and Physical Reviewed  -Patient has been re-examined  -No change in the plan of care  Wyn Forster, MD

## 2013-01-02 ENCOUNTER — Ambulatory Visit (HOSPITAL_BASED_OUTPATIENT_CLINIC_OR_DEPARTMENT_OTHER): Payer: BC Managed Care – PPO | Admitting: Anesthesiology

## 2013-01-02 ENCOUNTER — Encounter (HOSPITAL_BASED_OUTPATIENT_CLINIC_OR_DEPARTMENT_OTHER): Payer: Self-pay | Admitting: *Deleted

## 2013-01-02 ENCOUNTER — Encounter (HOSPITAL_BASED_OUTPATIENT_CLINIC_OR_DEPARTMENT_OTHER): Payer: Self-pay | Admitting: Anesthesiology

## 2013-01-02 ENCOUNTER — Encounter (HOSPITAL_BASED_OUTPATIENT_CLINIC_OR_DEPARTMENT_OTHER)
Admission: RE | Disposition: A | Discharge status: Home / Self Care | Payer: Self-pay | Source: Ambulatory Visit | Attending: Orthopedic Surgery

## 2013-01-02 ENCOUNTER — Ambulatory Visit (HOSPITAL_BASED_OUTPATIENT_CLINIC_OR_DEPARTMENT_OTHER)
Admission: RE | Admit: 2013-01-02 | Discharge: 2013-01-02 | Disposition: A | Discharge status: Home / Self Care | Payer: BC Managed Care – PPO | Source: Ambulatory Visit | Attending: Orthopedic Surgery | Admitting: Orthopedic Surgery

## 2013-01-02 DIAGNOSIS — IMO0001 Reserved for inherently not codable concepts without codable children: Secondary | ICD-10-CM | POA: Insufficient documentation

## 2013-01-02 DIAGNOSIS — G4730 Sleep apnea, unspecified: Secondary | ICD-10-CM | POA: Insufficient documentation

## 2013-01-02 DIAGNOSIS — K589 Irritable bowel syndrome without diarrhea: Secondary | ICD-10-CM | POA: Insufficient documentation

## 2013-01-02 DIAGNOSIS — I10 Essential (primary) hypertension: Secondary | ICD-10-CM | POA: Insufficient documentation

## 2013-01-02 DIAGNOSIS — Z79899 Other long term (current) drug therapy: Secondary | ICD-10-CM | POA: Insufficient documentation

## 2013-01-02 DIAGNOSIS — G5600 Carpal tunnel syndrome, unspecified upper limb: Secondary | ICD-10-CM | POA: Insufficient documentation

## 2013-01-02 DIAGNOSIS — Z87891 Personal history of nicotine dependence: Secondary | ICD-10-CM | POA: Insufficient documentation

## 2013-01-02 DIAGNOSIS — J309 Allergic rhinitis, unspecified: Secondary | ICD-10-CM | POA: Insufficient documentation

## 2013-01-02 DIAGNOSIS — E785 Hyperlipidemia, unspecified: Secondary | ICD-10-CM | POA: Insufficient documentation

## 2013-01-02 HISTORY — DX: Carpal tunnel syndrome, right upper limb: G56.01

## 2013-01-02 HISTORY — PX: CARPAL TUNNEL RELEASE: SHX101

## 2013-01-02 HISTORY — DX: Otitis media, unspecified, unspecified ear: H66.90

## 2013-01-02 LAB — POCT HEMOGLOBIN-HEMACUE: Hemoglobin: 15.5 g/dL (ref 13.0–17.0)

## 2013-01-02 SURGERY — CARPAL TUNNEL RELEASE
Anesthesia: General | Site: Wrist | Laterality: Right | Wound class: Clean

## 2013-01-02 MED ORDER — LACTATED RINGERS IV SOLN
INTRAVENOUS | Status: DC
  Administered 2013-01-02: 10:00:00 via INTRAVENOUS

## 2013-01-02 MED ORDER — FENTANYL CITRATE 0.05 MG/ML IJ SOLN: 50.0000 ug | INTRAMUSCULAR | Status: DC | PRN

## 2013-01-02 MED ORDER — ONDANSETRON HCL 4 MG/2ML IJ SOLN: 4.0000 mg | Freq: Four times a day (QID) | INTRAMUSCULAR | Status: DC | PRN

## 2013-01-02 MED ORDER — OXYCODONE-ACETAMINOPHEN 5-325 MG PO TABS: ORAL_TABLET | ORAL | Status: DC

## 2013-01-02 MED ORDER — OXYCODONE HCL 5 MG PO TABS: 5.0000 mg | ORAL_TABLET | Freq: Once | ORAL | Status: DC | PRN

## 2013-01-02 MED ORDER — MIDAZOLAM HCL 5 MG/5ML IJ SOLN
INTRAMUSCULAR | Status: DC | PRN
  Administered 2013-01-02: 2 mg via INTRAVENOUS

## 2013-01-02 MED ORDER — FENTANYL CITRATE 0.05 MG/ML IJ SOLN: 25.0000 ug | INTRAMUSCULAR | Status: DC | PRN

## 2013-01-02 MED ORDER — MIDAZOLAM HCL 2 MG/ML PO SYRP: 12.0000 mg | ORAL_SOLUTION | Freq: Once | ORAL | Status: DC | PRN

## 2013-01-02 MED ORDER — ONDANSETRON HCL 4 MG/2ML IJ SOLN
INTRAMUSCULAR | Status: DC | PRN
  Administered 2013-01-02: 4 mg via INTRAVENOUS

## 2013-01-02 MED ORDER — CHLORHEXIDINE GLUCONATE 4 % EX LIQD: 60.0000 mL | Freq: Once | CUTANEOUS | Status: DC

## 2013-01-02 MED ORDER — OXYCODONE HCL 5 MG/5ML PO SOLN: 5.0000 mg | Freq: Once | ORAL | Status: DC | PRN

## 2013-01-02 MED ORDER — DEXAMETHASONE SODIUM PHOSPHATE 10 MG/ML IJ SOLN
INTRAMUSCULAR | Status: DC | PRN
  Administered 2013-01-02: 10 mg via INTRAVENOUS

## 2013-01-02 MED ORDER — MIDAZOLAM HCL 2 MG/2ML IJ SOLN: 1.0000 mg | INTRAMUSCULAR | Status: DC | PRN

## 2013-01-02 MED ORDER — LIDOCAINE HCL 2 % IJ SOLN
INTRAMUSCULAR | Status: DC | PRN
  Administered 2013-01-02: 4.5 mL

## 2013-01-02 MED ORDER — LIDOCAINE HCL (CARDIAC) 20 MG/ML IV SOLN
INTRAVENOUS | Status: DC | PRN
  Administered 2013-01-02: 80 mg via INTRAVENOUS

## 2013-01-02 MED ORDER — FENTANYL CITRATE 0.05 MG/ML IJ SOLN
INTRAMUSCULAR | Status: DC | PRN
  Administered 2013-01-02: 100 ug via INTRAVENOUS

## 2013-01-02 SURGICAL SUPPLY — 37 items
BANDAGE ADHESIVE 1X3 (GAUZE/BANDAGES/DRESSINGS) IMPLANT
BANDAGE ELASTIC 3 VELCRO ST LF (GAUZE/BANDAGES/DRESSINGS) ×2 IMPLANT
BLADE SURG 15 STRL LF DISP TIS (BLADE) ×1 IMPLANT
BLADE SURG 15 STRL SS (BLADE) ×1
BNDG ESMARK 4X9 LF (GAUZE/BANDAGES/DRESSINGS) ×2 IMPLANT
BRUSH SCRUB EZ PLAIN DRY (MISCELLANEOUS) ×2 IMPLANT
CLOTH BEACON ORANGE TIMEOUT ST (SAFETY) ×2 IMPLANT
CORDS BIPOLAR (ELECTRODE) IMPLANT
COVER MAYO STAND STRL (DRAPES) ×2 IMPLANT
COVER TABLE BACK 60X90 (DRAPES) ×2 IMPLANT
CUFF TOURNIQUET SINGLE 18IN (TOURNIQUET CUFF) ×2 IMPLANT
DECANTER SPIKE VIAL GLASS SM (MISCELLANEOUS) IMPLANT
DRAPE EXTREMITY T 121X128X90 (DRAPE) ×2 IMPLANT
DRAPE SURG 17X23 STRL (DRAPES) ×2 IMPLANT
GLOVE BIO SURGEON STRL SZ 6.5 (GLOVE) ×2 IMPLANT
GLOVE BIOGEL M STRL SZ7.5 (GLOVE) ×2 IMPLANT
GLOVE ECLIPSE 6.5 STRL STRAW (GLOVE) ×2 IMPLANT
GLOVE ORTHO TXT STRL SZ7.5 (GLOVE) ×2 IMPLANT
GOWN BRE IMP PREV XXLGXLNG (GOWN DISPOSABLE) ×2 IMPLANT
GOWN PREVENTION PLUS XLARGE (GOWN DISPOSABLE) ×2 IMPLANT
NEEDLE 27GAX1X1/2 (NEEDLE) IMPLANT
PACK BASIN DAY SURGERY FS (CUSTOM PROCEDURE TRAY) ×2 IMPLANT
PAD CAST 3X4 CTTN HI CHSV (CAST SUPPLIES) ×1 IMPLANT
PADDING CAST ABS 4INX4YD NS (CAST SUPPLIES) ×1
PADDING CAST ABS COTTON 4X4 ST (CAST SUPPLIES) ×1 IMPLANT
PADDING CAST COTTON 3X4 STRL (CAST SUPPLIES) ×1
SPLINT PLASTER CAST XFAST 3X15 (CAST SUPPLIES) ×5 IMPLANT
SPLINT PLASTER XTRA FASTSET 3X (CAST SUPPLIES) ×5
SPONGE GAUZE 4X4 12PLY (GAUZE/BANDAGES/DRESSINGS) ×2 IMPLANT
STOCKINETTE 4X48 STRL (DRAPES) ×2 IMPLANT
STRIP CLOSURE SKIN 1/2X4 (GAUZE/BANDAGES/DRESSINGS) ×2 IMPLANT
SUT PROLENE 3 0 PS 2 (SUTURE) ×2 IMPLANT
SYR 3ML 23GX1 SAFETY (SYRINGE) IMPLANT
SYR CONTROL 10ML LL (SYRINGE) IMPLANT
TOWEL OR 17X24 6PK STRL BLUE (TOWEL DISPOSABLE) ×2 IMPLANT
TRAY DSU PREP LF (CUSTOM PROCEDURE TRAY) ×2 IMPLANT
UNDERPAD 30X30 INCONTINENT (UNDERPADS AND DIAPERS) ×2 IMPLANT

## 2013-01-02 NOTE — Anesthesia Preprocedure Evaluation (Signed)
Anesthesia Evaluation  Patient identified by MRN, date of birth, ID band Patient awake    Reviewed: Allergy & Precautions, H&P , NPO status , Patient's Chart, lab work & pertinent test results  Airway Mallampati: II  Neck ROM: full    Dental   Pulmonary sleep apnea , former smoker,          Cardiovascular hypertension,     Neuro/Psych  Headaches,  Neuromuscular disease    GI/Hepatic   Endo/Other  obese  Renal/GU      Musculoskeletal  (+) Fibromyalgia -  Abdominal   Peds  Hematology   Anesthesia Other Findings   Reproductive/Obstetrics                           Anesthesia Physical Anesthesia Plan  ASA: II  Anesthesia Plan: General   Post-op Pain Management:    Induction: Intravenous  Airway Management Planned: LMA  Additional Equipment:   Intra-op Plan:   Post-operative Plan:   Informed Consent: I have reviewed the patients History and Physical, chart, labs and discussed the procedure including the risks, benefits and alternatives for the proposed anesthesia with the patient or authorized representative who has indicated his/her understanding and acceptance.     Plan Discussed with: CRNA, Anesthesiologist and Surgeon  Anesthesia Plan Comments:         Anesthesia Quick Evaluation

## 2013-01-02 NOTE — Anesthesia Postprocedure Evaluation (Signed)
Anesthesia Post Note  Patient: [name]  Procedure(s) Performed: Procedure(s) (LRB): CARPAL TUNNEL RELEASE (Right)  Anesthesia type: General  Patient location: PACU  Post pain: Pain level controlled and Adequate analgesia  Post assessment: Post-op Vital signs reviewed, Patient's Cardiovascular Status Stable, Respiratory Function Stable, Patent Airway and Pain level controlled  Last Vitals:  Filed Vitals:   01/02/13 1100  BP:   Pulse: 80  Temp:   Resp: 17    Post vital signs: Reviewed and stable  Level of consciousness: awake, alert  and oriented  Complications: No apparent anesthesia complications

## 2013-01-02 NOTE — Brief Op Note (Signed)
01/02/2013  10:34 AM  PATIENT:  Arnoldo Hooker  62 y.o. male  PRE-OPERATIVE DIAGNOSIS:  RIGHT CARPAL TUNNEL SYNDROME  POST-OPERATIVE DIAGNOSIS:  RIGHT CARPAL TUNNEL SYNDROME  PROCEDURE:  Procedure(s): CARPAL TUNNEL RELEASE (Right)  SURGEON:  Surgeon(s) and Role:    * Wyn Forster., MD - Primary  PHYSICIAN ASSISTANT:   ASSISTANTS: surgical tech  ANESTHESIA:   general  EBL:  Total I/O In: 700 [I.V.:700] Out: -   BLOOD ADMINISTERED:none  DRAINS: none   LOCAL MEDICATIONS USED:  LIDOCAINE   SPECIMEN:  No Specimen  DISPOSITION OF SPECIMEN:  N/A  COUNTS:  YES  TOURNIQUET:   Total Tourniquet Time Documented: Upper Arm (Right) - [id number] minutes Total: Upper Arm (Right) - 119147 minutes   DICTATION: .Other Dictation: Dictation Number [id number]  PLAN OF CARE: Discharge to home after PACU  PATIENT DISPOSITION:  PACU - hemodynamically stable.   Delay start of Pharmacological VTE agent (>24hrs) due to surgical blood loss or risk of bleeding: not applicable

## 2013-01-02 NOTE — Anesthesia Procedure Notes (Signed)
Procedure Name: LMA Insertion Date/Time: 01/02/2013 10:04 AM Performed by: Burna Cash Pre-anesthesia Checklist: Patient identified, Emergency Drugs available, Suction available and Patient being monitored Patient Re-evaluated:Patient Re-evaluated prior to inductionOxygen Delivery Method: Circle System Utilized Preoxygenation: Pre-oxygenation with 100% oxygen Intubation Type: IV induction Ventilation: Mask ventilation without difficulty LMA: LMA inserted LMA Size: 5.0 Number of attempts: 1 Airway Equipment and Method: bite block Placement Confirmation: positive ETCO2 Tube secured with: Tape Dental Injury: Teeth and Oropharynx as per pre-operative assessment

## 2013-01-02 NOTE — Op Note (Signed)
[id number] 

## 2013-01-02 NOTE — Transfer of Care (Signed)
Immediate Anesthesia Transfer of Care Note  Patient: [name]  Procedure(s) Performed: Procedure(s): CARPAL TUNNEL RELEASE (Right)  Patient Location: PACU  Anesthesia Type:General  Level of Consciousness: sedated  Airway & Oxygen Therapy: Patient Spontanous Breathing and Patient connected to face mask oxygen  Post-op Assessment: Report given to PACU RN and Post -op Vital signs reviewed and stable  Post vital signs: Reviewed and stable  Complications: No apparent anesthesia complications

## 2013-01-03 ENCOUNTER — Encounter (HOSPITAL_BASED_OUTPATIENT_CLINIC_OR_DEPARTMENT_OTHER): Payer: Self-pay | Admitting: Orthopedic Surgery

## 2013-01-03 NOTE — Op Note (Signed)
[name], [name]              ACCOUNT NO.:  [id number]  MEDICAL RECORD NO.:  [id number]  LOCATION:                               FACILITY:  [facility]  PHYSICIAN:  Todd Fitch. Eyanna Mcgonagle, M.D. DATE OF BIRTH:  July 24, 1950  DATE OF PROCEDURE:  01/02/2013 DATE OF DISCHARGE:  01/02/2013                              OPERATIVE REPORT   PREOPERATIVE DIAGNOSIS:  Chronic median entrapment neuropathy, right carpal tunnel.  POSTOPERATIVE DIAGNOSIS:  Chronic median entrapment neuropathy, right carpal tunnel.  OPERATION:  Release of right transverse carpal ligament.  OPERATING SURGEON:  Todd Fitch. Jex Strausbaugh, MD  ASSISTANT:  Surgical technician.  ANESTHESIA:  General by LMA.  SUPERVISING ANESTHESIOLOGIST:  Achille Rich, MD.  INDICATIONS:  [name] is a 62 year old gentleman referred for evaluation and management of bilateral hand numbness.  He has had electrodiagnostic studies confirming bilateral significant carpal tunnel syndrome.  He is status post release of his left transcarpal ligament and now returns for release of the right transverse carpal ligament.  Preoperatively, he was reminded potential risks and benefits of surgery. Questions had been answered in detail.  He had tolerated his postoperative pain medication following the prior surgery.  PROCEDURE IN DETAIL:  [name] was brought to room 2 of the [facility] and placed in supine position on the operating table.  Following the induction of general anesthesia by LMA technique under Dr. Chaney Malling strict supervision, the right hand and arm were prepped with Betadine soap and solution, sterilely draped.  A pneumatic tourniquet was applied to the proximal right brachium.  Following exsanguination of right arm with Esmarch bandage, arterial tourniquet was inflated to 220 mmHg.  Following routine surgical time-out, confirming the proper surgical site and procedure, we proceeded with a short incision in the line of  the ring finger of the palm.  Subcutaneous tissues were carefully divided in the palmar fascia.  This split longitudinally in line of its fibers to the superficial palmar arch and the common sensory branch of the median nerve.  A large sensory branch crossing at the distal margin of the transcarpal ligament was identified and gently preserved throughout the procedure.  The transverse carpal ligament was identified and the canal sounded with a Insurance risk surveyor.  The ligament was sequentially released with scissors along its ulnar border extending into the distal forearm.  This widely opened carpal canal.  No mass or other predicaments were noted.  Bleeding points along the margin of the released ligament were electrocauterized bipolar current followed by repair of the skin with intradermal 3-0 Prolene suture.  No masses or other anatomical predicaments were noted within the canal. The wound was then repaired with intradermal 3-0 Prolene suture.  A compressive dressing was applied with sterile gauze, sterile Webril, and a volar plaster splint maintaining the wrist in 15 degrees dorsiflexion.  For aftercare, [name] was provided a new prescription for oxycodone 5 mg 1 p.o. q.4-6 h. p.r.n. pain, 20 tabs refill.     [name], M.D.     RVS/MEDQ  D:  01/02/2013  T:  01/03/2013  Job:  [id number]

## 2013-01-18 ENCOUNTER — Other Ambulatory Visit: Payer: Self-pay | Admitting: Family Medicine

## 2013-01-18 DIAGNOSIS — R42 Dizziness and giddiness: Secondary | ICD-10-CM

## 2013-01-18 DIAGNOSIS — J309 Allergic rhinitis, unspecified: Secondary | ICD-10-CM

## 2013-01-18 DIAGNOSIS — J3489 Other specified disorders of nose and nasal sinuses: Secondary | ICD-10-CM

## 2013-01-19 ENCOUNTER — Ambulatory Visit
Admission: RE | Admit: 2013-01-19 | Discharge: 2013-01-19 | Disposition: A | Discharge status: Home / Self Care | Payer: BC Managed Care – PPO | Source: Ambulatory Visit | Attending: Family Medicine | Admitting: Family Medicine

## 2013-01-19 DIAGNOSIS — R42 Dizziness and giddiness: Secondary | ICD-10-CM

## 2013-01-19 DIAGNOSIS — J309 Allergic rhinitis, unspecified: Secondary | ICD-10-CM

## 2013-01-19 DIAGNOSIS — J3489 Other specified disorders of nose and nasal sinuses: Secondary | ICD-10-CM

## 2013-02-02 ENCOUNTER — Other Ambulatory Visit: Payer: Self-pay | Admitting: Otolaryngology

## 2013-10-14 IMAGING — CR DG WRIST COMPLETE 3+V*L*
4 series · 4 of 4 positions shown · non-contrast
Comparison: None.

CLINICAL DATA: Dorsal wrist pain extending into the ring and small
fingers.

LEFT WRIST - COMPLETE 3+ VIEW

[view not recorded (1 of 4)]
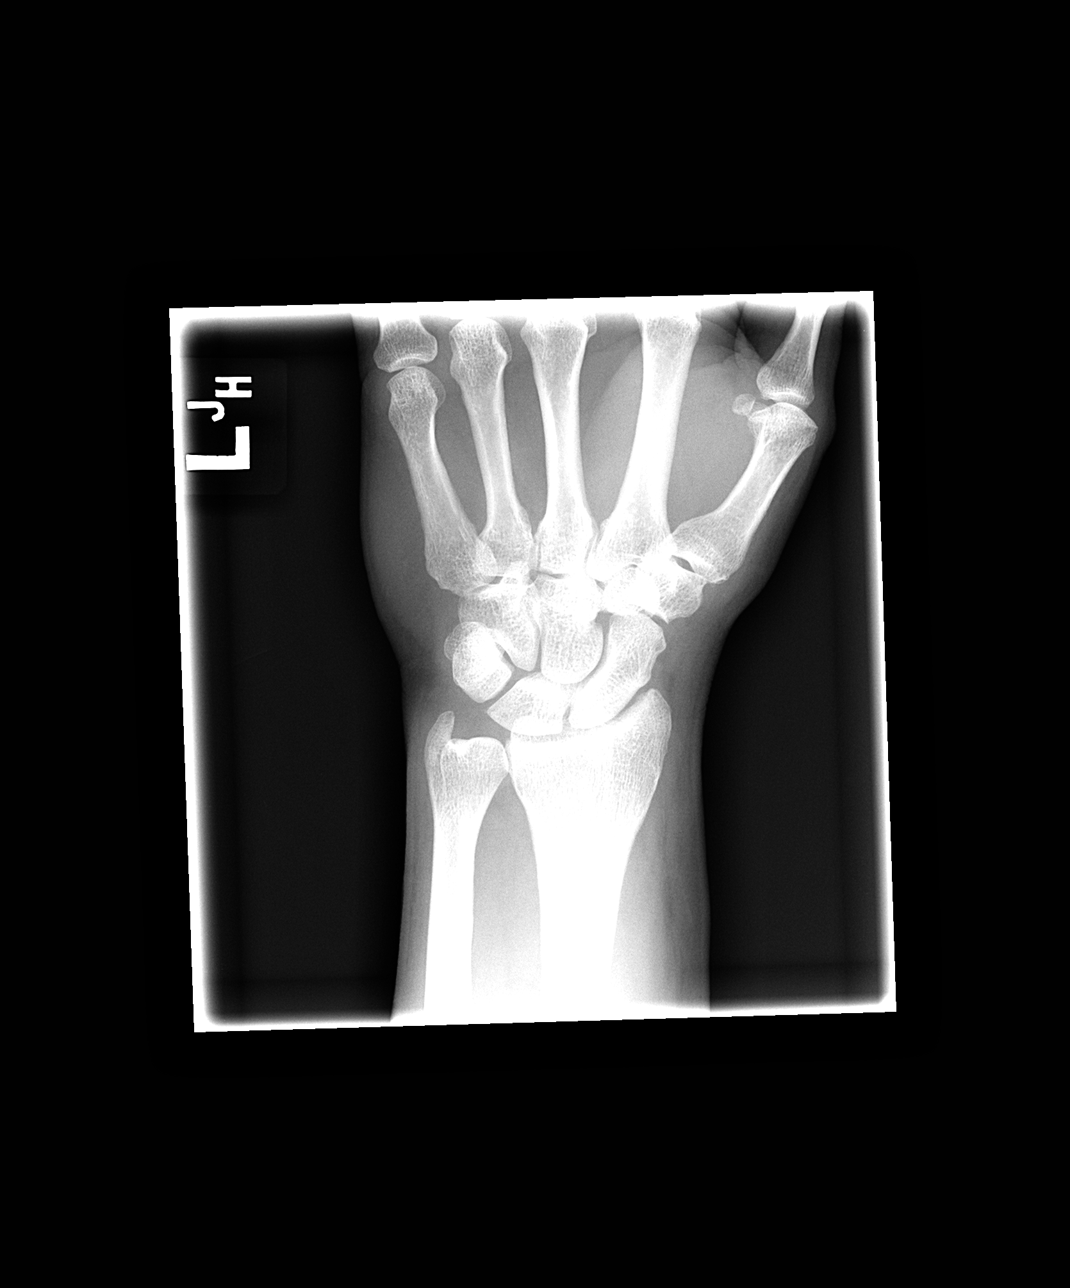

[view not recorded (2 of 4)]
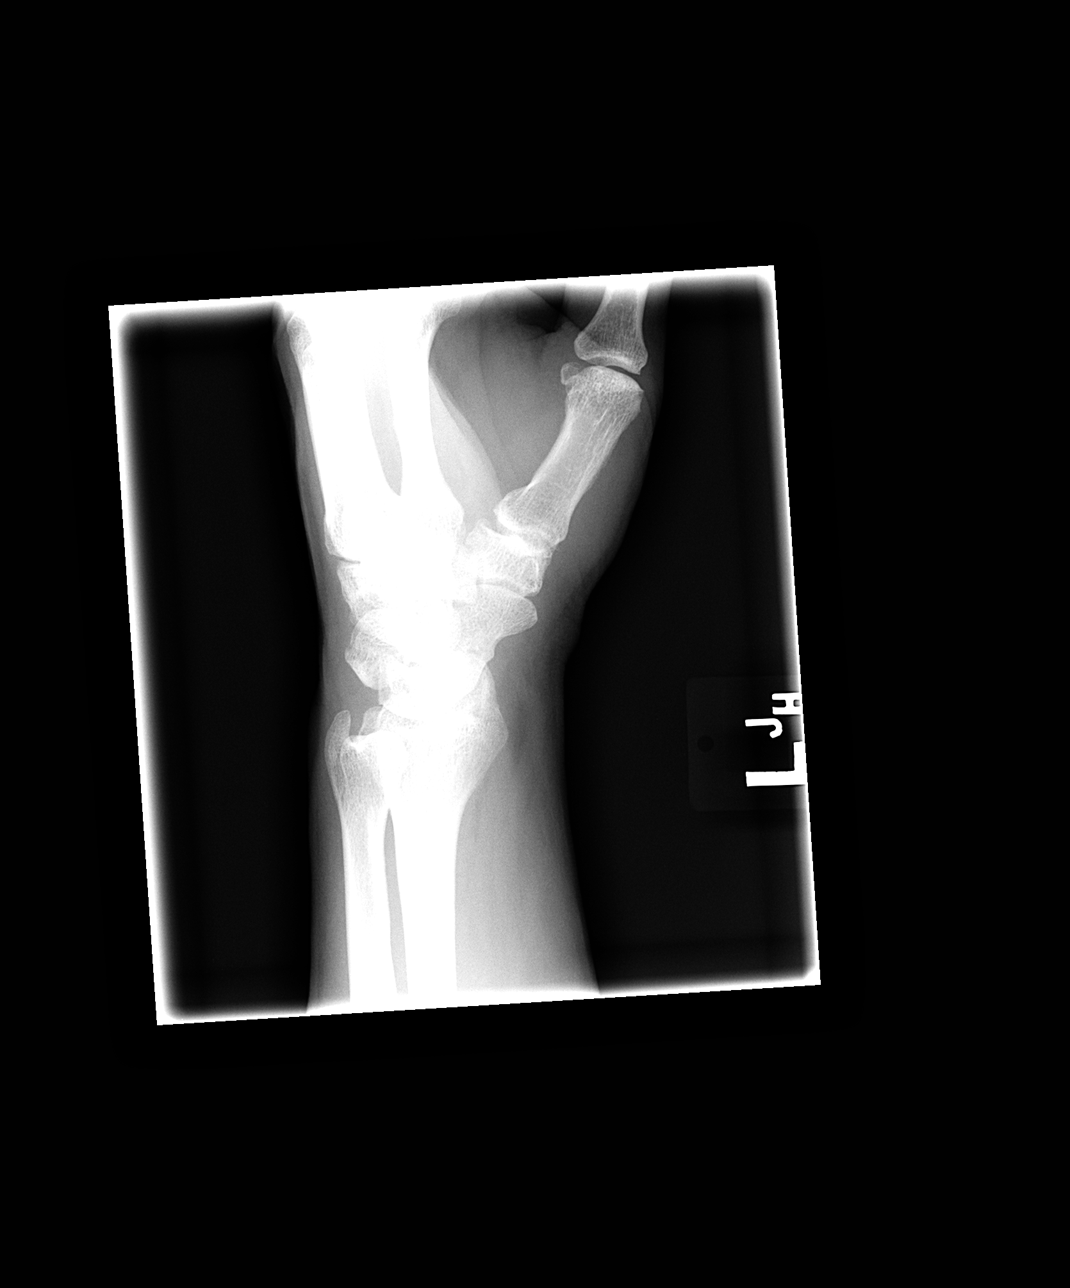

[view not recorded (3 of 4)]
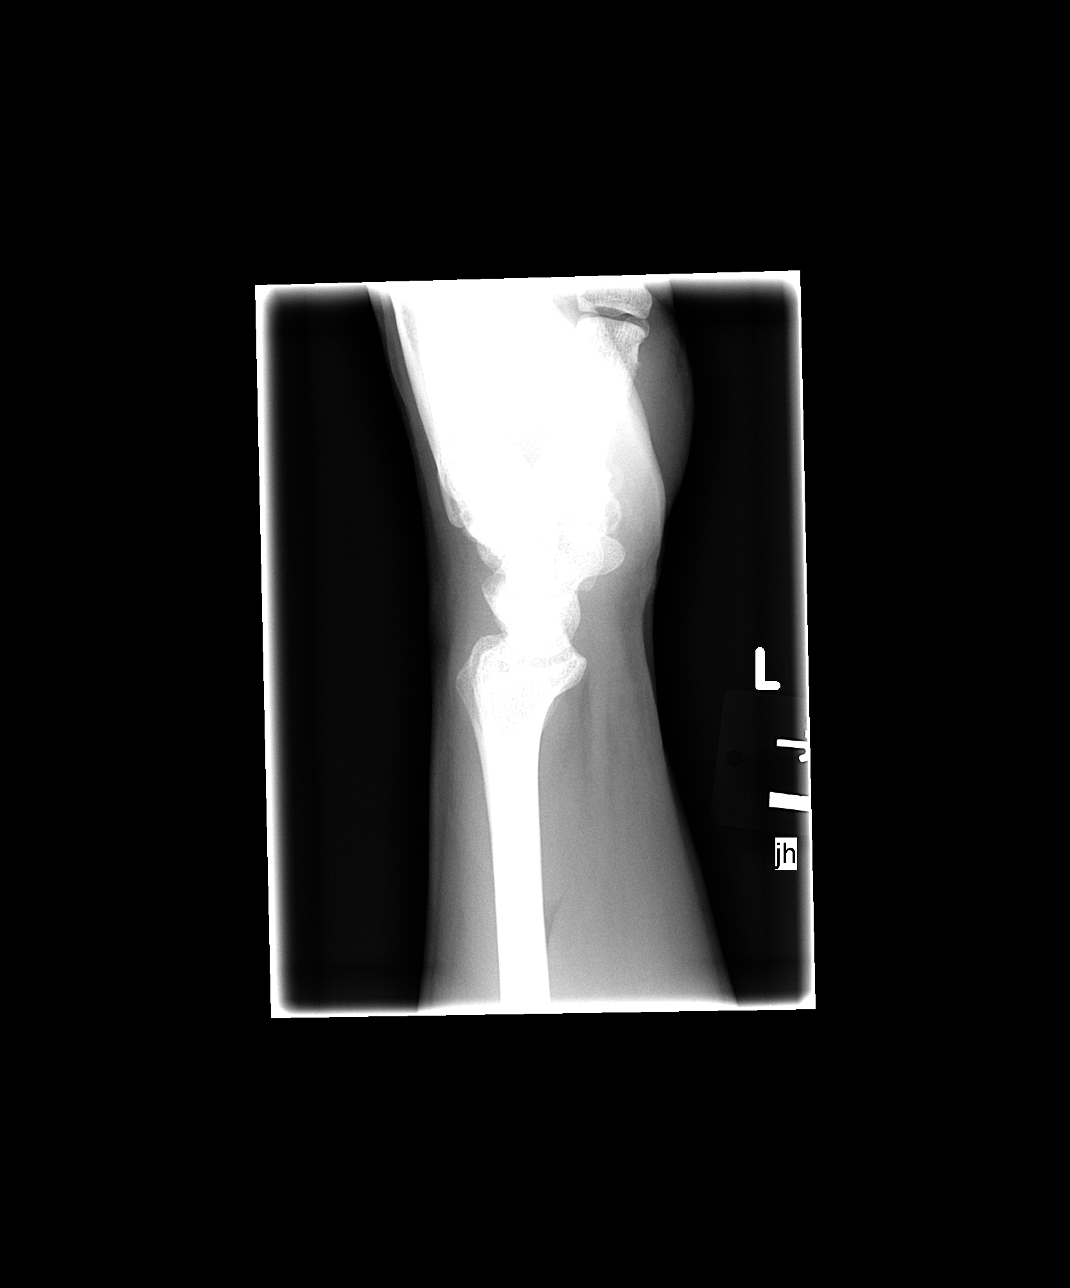

[view not recorded (4 of 4)]
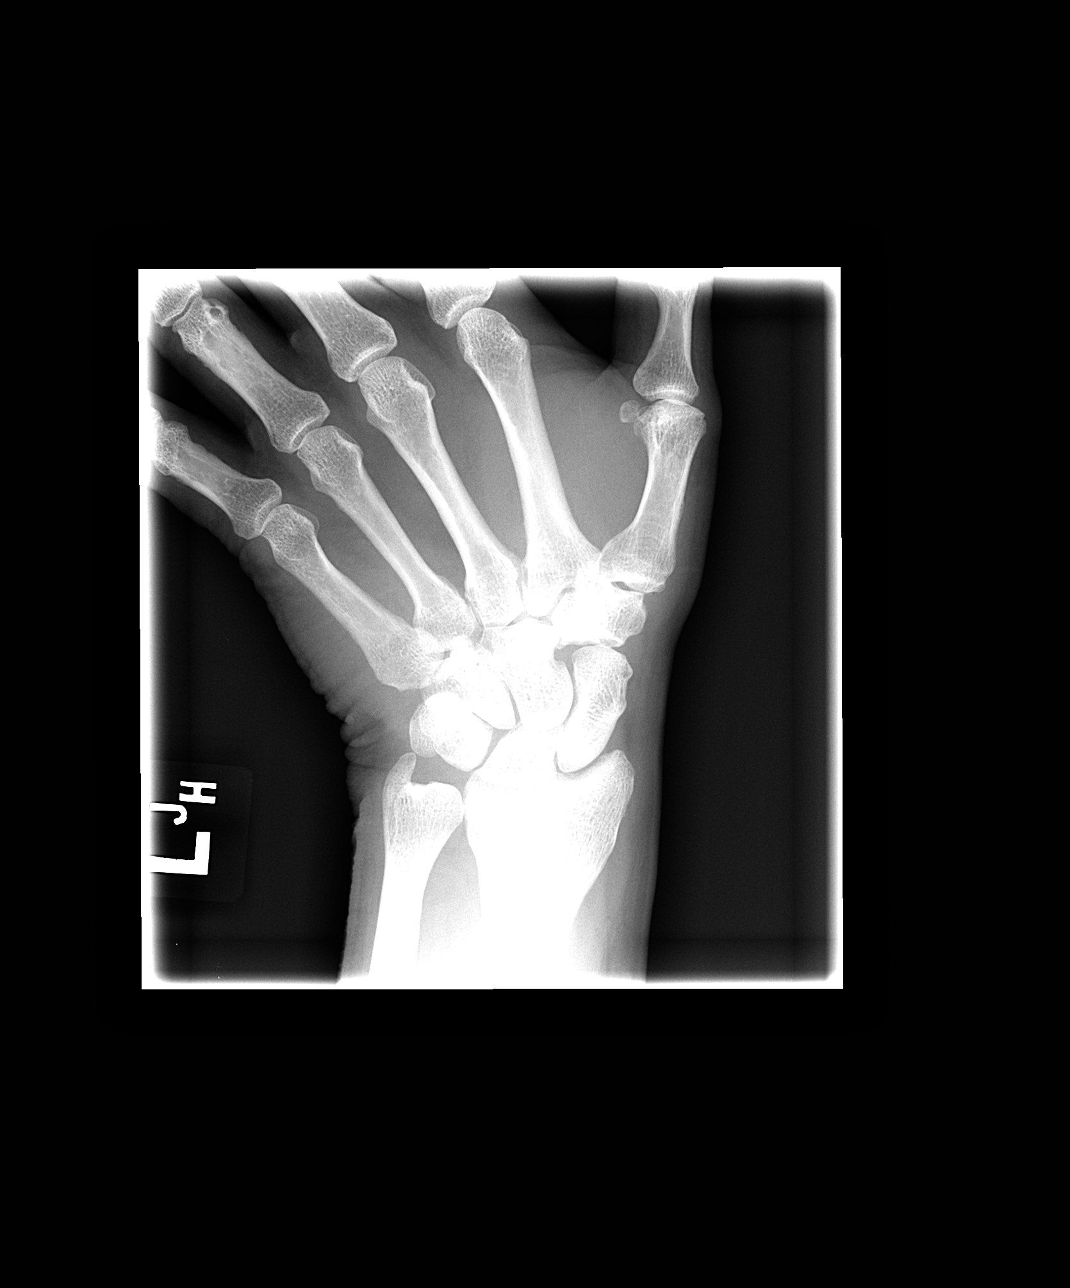

[4 of 4 positions shown; findings below may reference images not displayed]

FINDINGS: No fracture or acute bony finding noted.  No significant
osseous abnormality observed.
IMPRESSION: 1.  No significant abnormality observed. Correlate with potential
sites of the ulnar nerve impingement based on inciting factors in
determining the need for follow-up cross-sectional imaging in the
wrist or elbow.

## 2014-01-21 ENCOUNTER — Ambulatory Visit (INDEPENDENT_AMBULATORY_CARE_PROVIDER_SITE_OTHER): Payer: BC Managed Care – PPO | Admitting: Internal Medicine

## 2014-01-21 ENCOUNTER — Encounter: Payer: Self-pay | Admitting: Internal Medicine

## 2014-01-21 VITALS — BP 140/80 | HR 79 | Ht 70.0 in | Wt 229.8 lb

## 2014-01-21 DIAGNOSIS — R072 Precordial pain: Secondary | ICD-10-CM

## 2014-01-21 DIAGNOSIS — E559 Vitamin D deficiency, unspecified: Secondary | ICD-10-CM

## 2014-01-21 DIAGNOSIS — M797 Fibromyalgia: Secondary | ICD-10-CM | POA: Insufficient documentation

## 2014-01-21 DIAGNOSIS — IMO0001 Reserved for inherently not codable concepts without codable children: Secondary | ICD-10-CM

## 2014-01-21 DIAGNOSIS — R079 Chest pain, unspecified: Secondary | ICD-10-CM

## 2014-01-21 DIAGNOSIS — R7303 Prediabetes: Secondary | ICD-10-CM

## 2014-01-21 DIAGNOSIS — R7309 Other abnormal glucose: Secondary | ICD-10-CM

## 2014-01-21 DIAGNOSIS — R002 Palpitations: Secondary | ICD-10-CM | POA: Diagnosis not present

## 2014-01-21 DIAGNOSIS — I10 Essential (primary) hypertension: Secondary | ICD-10-CM | POA: Diagnosis not present

## 2014-01-21 DIAGNOSIS — E785 Hyperlipidemia, unspecified: Secondary | ICD-10-CM

## 2014-01-21 DIAGNOSIS — Z9989 Dependence on other enabling machines and devices: Secondary | ICD-10-CM

## 2014-01-21 DIAGNOSIS — E669 Obesity, unspecified: Secondary | ICD-10-CM | POA: Insufficient documentation

## 2014-01-21 DIAGNOSIS — G4733 Obstructive sleep apnea (adult) (pediatric): Secondary | ICD-10-CM

## 2014-01-21 DIAGNOSIS — K219 Gastro-esophageal reflux disease without esophagitis: Secondary | ICD-10-CM

## 2014-01-21 NOTE — Patient Instructions (Signed)
Your physician has recommended that you wear an event monitor. Event monitors are medical devices that record the heart's electrical activity. Doctors most often us these monitors to diagnose arrhythmias. Arrhythmias are problems with the speed or rhythm of the heartbeat. The monitor is a small, portable device. You can wear one while you do your normal daily activities. This is usually used to diagnose what is causing palpitations/syncope (passing out). ** you will wear this for 7 days  Dr Hilty has ordered a cardiometabolic test - this is done AT [facility]  What is a Cardiopulmonary Exercise Test (CPET)?   The Cardiopulmonary Exercise Test is a highly sensitive, non-invasive stress test. It is considered a stress test because the exercise stresses your body's systems by making them work faster and harder. A disease or condition that affects the heart, lungs or muscles will limit how much faster and harder these systems can work. A CPET assesses how well the heart, lungs, and muscles are working individually, and how these systems are working in unison. Your heart and lungs work together to deliver oxygen to your muscles, where it is used to make energy, and to remove carbon dioxide from your body.  The full cardiopulmonary system is assessed during a CPET by measuring the amount of oxygen your body is using, the amount of carbon dioxide it is producing, your breathing pattern, and electrocardiogram (EKG) while you are riding a stationary bicycle.  The traditional treadmill stress test only relies on the EKG, which only partially assesses the heart and nothing else. Besides detecting problems in multiple body systems, the CPET is also used to monitor changes in your disease condition, the effect of certain medications on your body, and if medical therapy is improving your condition.  What conditions can be detected/monitored by the Cardiopulmonary ExerciseTest?  Heart, lung, and metabolic  conditions may cause shortness of breath, exercise intolerance or discomfort and pain in the chest. The CPET is the only test that can simultaneously determine which of these systems is causing the problem   Your physician recommends that you schedule a follow-up appointment after your MET TEST/MONITOR  

## 2014-01-21 NOTE — Progress Notes (Signed)
OFFICE NOTE  Chief Complaint:  Chest pain, palpitations  Primary Care Physician: Gara Kroner, MD  HPI:  [name] is a pleasant 63 year old male with a number of medical problems. His past medical history is significant for hypertension, dyslipidemia, prediabetes and a strong family history of heart disease. He also has fibromyalgia and obstructive sleep apnea on CPAP. Recently he has been having pressure in his chest which is constant, sometimes worse with exertion and relieved by rest. Mostly on the left side of the chest. Occasionally has symptoms on the right side of his chest.  He also is describing some palpitations. He reportedly had these in the past and now has them several times a week. [name] had previously seen Dr. Doreatha Lew for this and was reassured. He had apparently had a remote heart catheterization which was negative. He does report significant fatigue with exertion. He also has bilateral knee pain and is generally unable to exercise on a treadmill. He is a nonsmoker.  PMHx:  Past Medical History  Diagnosis Date  . Hyperlipidemia   . Seasonal allergies   . Irritable bowel syndrome     no current med.  . Hypertension     under control with meds., has been on med. since 2008  . Headache(784.0)     sinus headaches; also dull headache almost every day, states related to fibromyalgia  . Sleep apnea     uses CPAP nightly  . Fibromyalgia   . Carpal tunnel syndrome of right wrist 12/2012  . Otitis media 12/25/2012    started antibiotic 12/25/2012 x 14 days    Past Surgical History  Procedure Laterality Date  . Cholecystectomy    . Knee arthroscopy Bilateral   . Nasal septum surgery    . Vasectomy    . Carpal tunnel release Left 11/28/2012    Procedure: CARPAL TUNNEL RELEASE;  Surgeon: Cammie Sickle., MD;  Location: [place];  Service: Orthopedics;  Laterality: Left;  . Carpal tunnel release Right 01/02/2013    Procedure: CARPAL TUNNEL  RELEASE;  Surgeon: Cammie Sickle., MD;  Location: [place];  Service: Orthopedics;  Laterality: Right;    FAMHx:  Family History  Problem Relation Age of Onset  . Bladder Cancer Mother     bladder  . Hypertension Mother   . Heart disease Father     MI - CABG, CHF, DM, arrhythmia  . Hypertension Sister     high cholesterol   . Heart disease Sister     HTN, high cholesterol  . Heart attack Brother     half brother - CABG, CHF  . Hypertension Daughter   . Hyperlipidemia Daughter     SOCHx:   reports that he has quit smoking. He has never used smokeless tobacco. He reports that he does not drink alcohol or use illicit drugs.  ALLERGIES:  No Known Allergies  ROS: A comprehensive review of systems was negative except for: Constitutional: positive for fatigue Respiratory: positive for cough and dyspnea on exertion Cardiovascular: positive for exertional chest pressure/discomfort and palpitations Musculoskeletal: positive for myalgias Allergic/Immunologic: positive for allergic rhinitis  HOME MEDS: Current Outpatient Prescriptions  Medication Sig Dispense Refill  . amLODipine (NORVASC) 2.5 MG tablet Take 2.5 mg by mouth daily.       . Ascorbic Acid (VITAMIN C) 1000 MG tablet Take 500 mg by mouth 2 (two) times daily.       . carisoprodol (SOMA) 350 MG  tablet Take 350 mg by mouth every 12 (twelve) hours as needed.       . Cholecalciferol (VITAMIN D PO) Take 2,000 Units by mouth 2 (two) times daily.       . CYMBALTA 60 MG capsule Take 60 mg by mouth daily.       Marland Kitchen ESZOPICLONE 3 MG tablet Take 3 mg by mouth at bedtime.       . fluticasone (VERAMYST) 27.5 MCG/SPRAY nasal spray Place 2 sprays into the nose daily.      Marland Kitchen ibuprofen (ADVIL,MOTRIN) 200 MG tablet Take 600 mg by mouth every 6 (six) hours as needed.      Marland Kitchen LYRICA 150 MG capsule Take 150 mg by mouth 2 (two) times daily.       . Misc Natural Products (OSTEO BI-FLEX TRIPLE STRENGTH PO) Take 2 tablets by  mouth at bedtime.      . montelukast (SINGULAIR) 10 MG tablet Take 10 mg by mouth at bedtime.      . Multiple Vitamin (MULTIVITAMIN) tablet Take 1 tablet by mouth daily.      . NON FORMULARY at bedtime. CPAP      . Omega-3 Krill Oil 500 MG CAPS Take 1,000 mg by mouth daily.      Marland Kitchen omeprazole (PRILOSEC) 40 MG capsule Take 40 mg by mouth daily.      . pseudoephedrine-acetaminophen (TYLENOL SINUS) 30-500 MG TABS Take 1 tablet by mouth every 4 (four) hours as needed.      . rosuvastatin (CRESTOR) 10 MG tablet Take 10 mg by mouth daily.        No current facility-administered medications for this visit.    LABS/IMAGING: No results found for this or any previous visit (from the past 48 hour(s)). No results found.  VITALS: BP 140/80  Pulse 79  Ht 5\' 10"  (1.778 m)  Wt 229 lb 12.8 oz (104.237 kg)  BMI 32.97 kg/m2  EXAM: General appearance: alert and no distress Neck: no carotid bruit, no JVD and thyroid not enlarged, symmetric, no tenderness/mass/nodules Lungs: clear to auscultation bilaterally Heart: regular rate and rhythm, S1, S2 normal, no murmur, click, rub or gallop Abdomen: soft, non-tender; bowel sounds normal; no masses,  no organomegaly Extremities: extremities normal, atraumatic, no cyanosis or edema Pulses: 2+ and symmetric Skin: Skin color, texture, turgor normal. No rashes or lesions Neurologic: Grossly normal Psych: Normal  EKG: Normal sinus rhythm at 79  ASSESSMENT: 1. Chest pressure with typical and atypical symptoms 2. Palpitations 3. Fibromyalgia 4. Hypertension 5. Uncontrolled dyslipidemia 6. Prediabetes 7. Obstructive sleep apnea on CPAP 8. Strong family history of premature coronary artery disease  PLAN: 1.   [name] has a number of cardiac risk factors and family history of heart disease. It is difficult to ascertain whether his chest pain symptoms aren't typical or atypical for cardiac chest pain. I would recommend exercise testing to evaluate for  ischemia. He does feel that he is able to exercise on a upright cycle, therefore I would recommend cardiopulmonary exercise testing. This is somewhat more comprehensive that can give [place] a better idea of his chest pressure and whether it is related to ischemia or perhaps his weight and/or fibromyalgia. In addition, he is describing what sounds like palpitations. I would recommend a one week event monitor to evaluate for these. We may be able to adjust his medications and either add a low-dose beta blocker or perhaps exchange it for his low-dose amlodipine to help with symptoms. Finally, his cholesterol  is not at goal. He would likely benefit from an increased dose of his Crestor and perhaps the addition of a fibrate given his high triglycerides. We will discuss this at followup.  Thank you again for the kind referral.  Pixie Casino, MD, [facility] Attending Cardiologist [facility]  HILTY,Kenneth C 01/21/2014, 8:54 AM

## 2014-01-25 ENCOUNTER — Ambulatory Visit (HOSPITAL_COMMUNITY): Payer: BC Managed Care – PPO | Attending: Internal Medicine

## 2014-01-25 DIAGNOSIS — R0989 Other specified symptoms and signs involving the circulatory and respiratory systems: Principal | ICD-10-CM | POA: Insufficient documentation

## 2014-01-25 DIAGNOSIS — R0609 Other forms of dyspnea: Secondary | ICD-10-CM | POA: Diagnosis not present

## 2014-01-25 DIAGNOSIS — R002 Palpitations: Secondary | ICD-10-CM

## 2014-01-25 DIAGNOSIS — R079 Chest pain, unspecified: Secondary | ICD-10-CM | POA: Insufficient documentation

## 2014-02-03 IMAGING — CT CT PARANASAL SINUSES LIMITED
1 series · 10 of 12 positions shown, 13 images · non-contrast
Comparison: None.

CLINICAL DATA: Sinus pain, pressure and congestion. Vertigo.
History of septoplasty in [REDACTED]. Recent antibiotic therapy.

EXAM:
CT PARANASAL SINUS LIMITED WITHOUT CONTRAST
TECHNIQUE: Multidetector CT images of the paranasal sinuses were obtained in a
single plane without contrast.

[Series 3: coronal soft · axial · 0.33mm/px · z∈[+30,+120]mm · 10 of 12 slices shown, 13 images]
[im 2/12  brain]
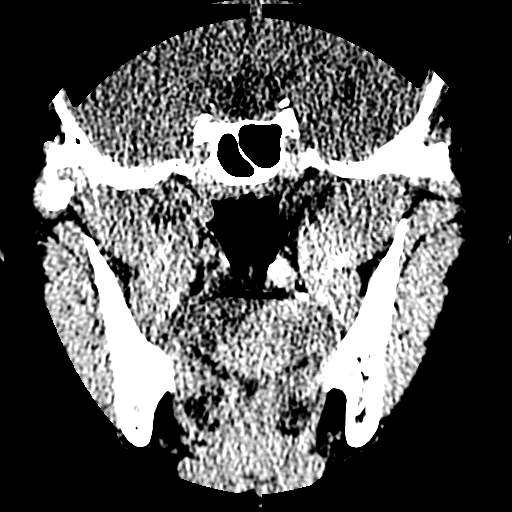
[im 2/12  bone]
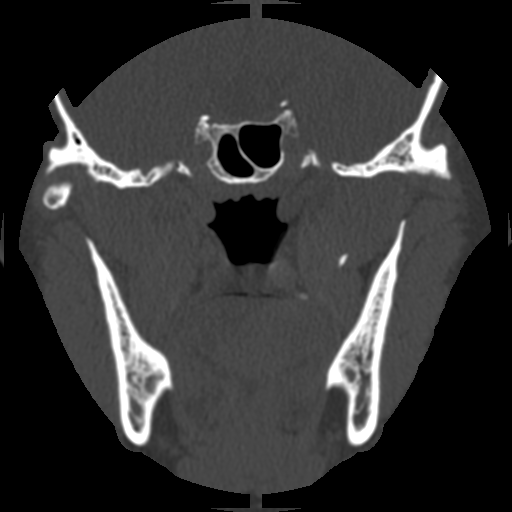
[im 3/12  bone]
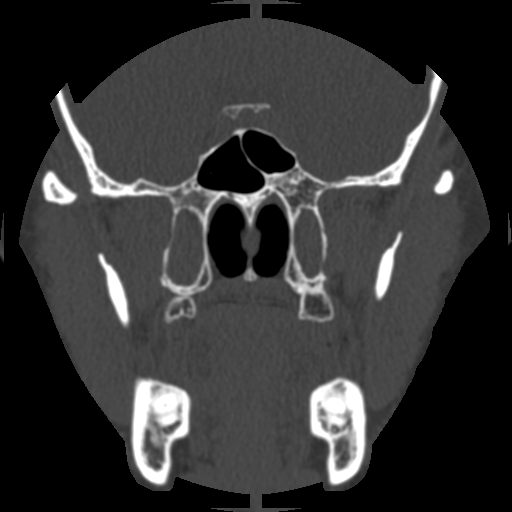
[im 4/12  bone]
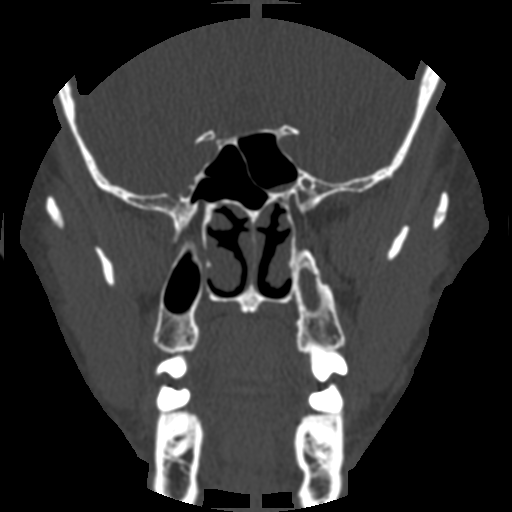
[im 5/12  bone]
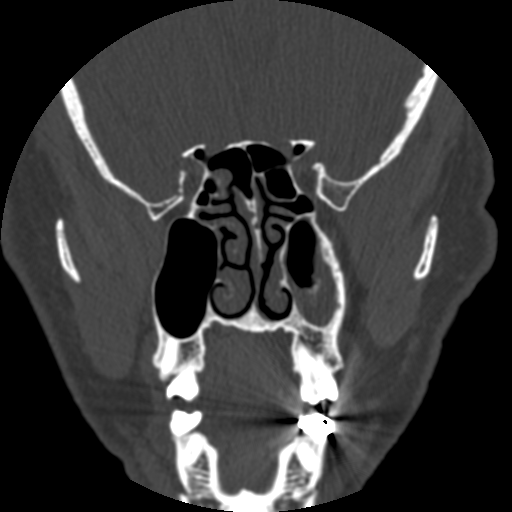
[im 6/12  brain]
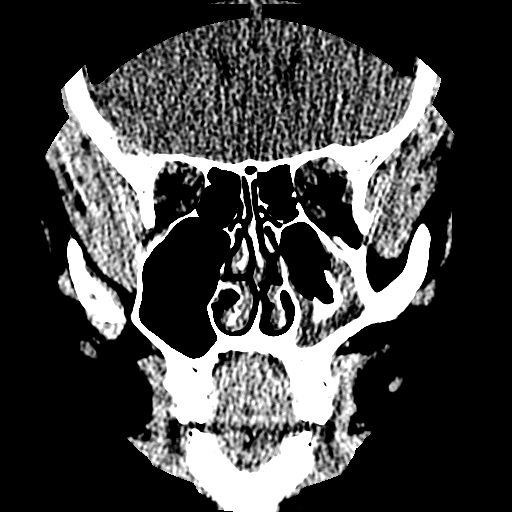
[im 6/12  bone]
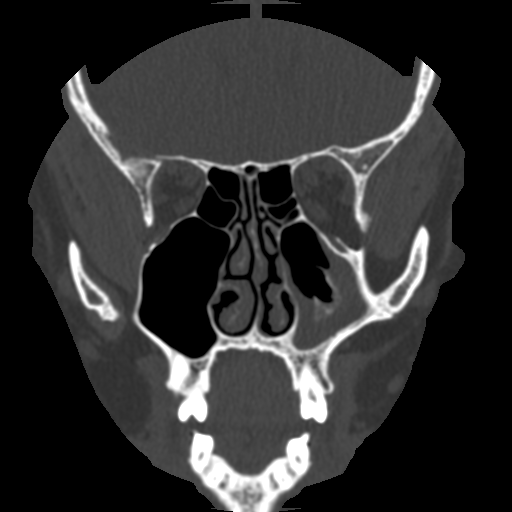
[im 7/12  bone]
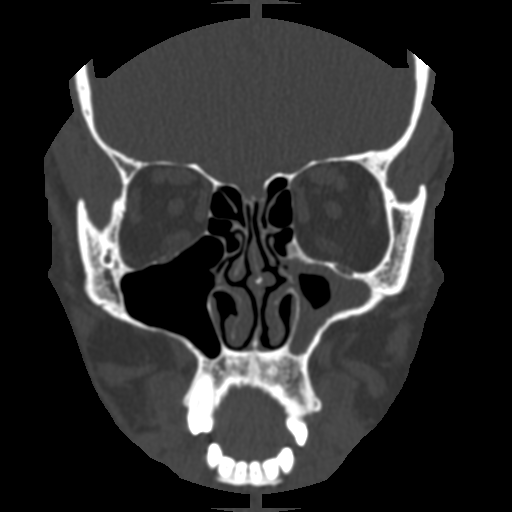
[im 8/12  bone]
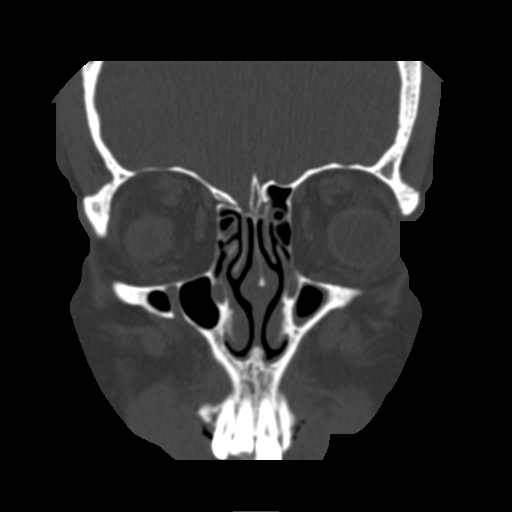
[im 9/12  bone]
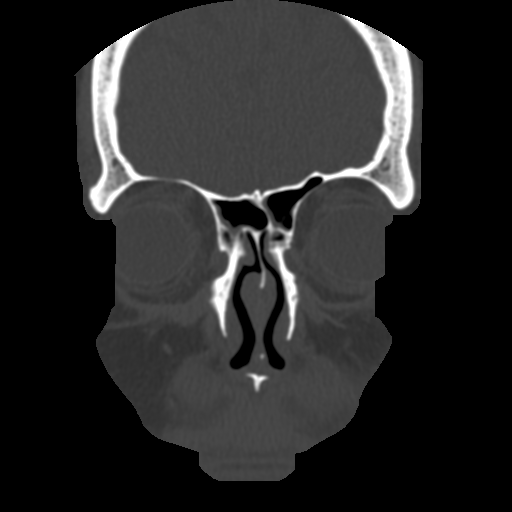
[im 10/12  brain]
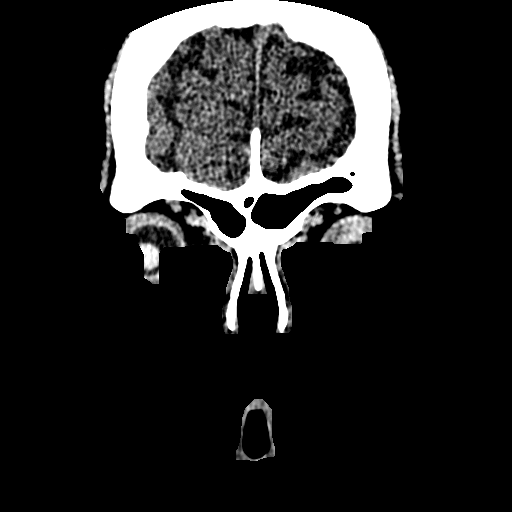
[im 10/12  bone]
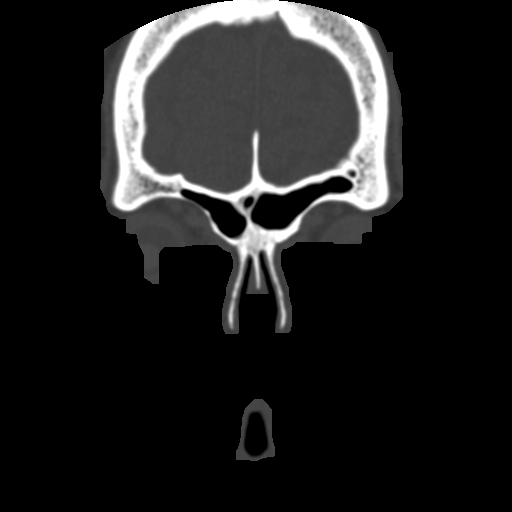
[im 11/12  bone]
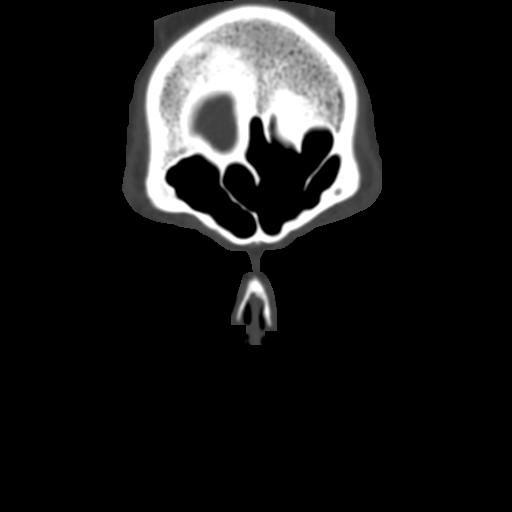

[10 of 12 positions shown; findings below may reference images not displayed]

FINDINGS: The right maxillary sinus demonstrates diffuse mucosal thickening
with subtotal opacification. There is some associated intra sinus
calcification, but no definite air-fluid levels. There is mild
mucosal thickening within the right division of the sphenoid sinus
and within both frontal sinuses. The left maxillary sinus appears
clear. There is no evidence of bone destruction or periosteal
reaction.

No orbital or other significant soft tissue abnormalities are
identified.
IMPRESSION: Chronic appearing sinus disease as described with subtotal
opacification of the right maxillary sinus. Associated intra sinus
calcification can be seen with chronic fungal infection.

## 2014-02-21 ENCOUNTER — Encounter: Payer: Self-pay | Admitting: Internal Medicine

## 2014-02-21 ENCOUNTER — Ambulatory Visit (INDEPENDENT_AMBULATORY_CARE_PROVIDER_SITE_OTHER): Payer: BC Managed Care – PPO | Admitting: Internal Medicine

## 2014-02-21 ENCOUNTER — Other Ambulatory Visit: Payer: Self-pay

## 2014-02-21 VITALS — BP 151/87 | HR 79 | Ht 70.0 in | Wt 232.0 lb

## 2014-02-21 DIAGNOSIS — I10 Essential (primary) hypertension: Secondary | ICD-10-CM

## 2014-02-21 DIAGNOSIS — G4733 Obstructive sleep apnea (adult) (pediatric): Secondary | ICD-10-CM

## 2014-02-21 DIAGNOSIS — M797 Fibromyalgia: Secondary | ICD-10-CM

## 2014-02-21 DIAGNOSIS — E66811 Obesity, class 1: Secondary | ICD-10-CM

## 2014-02-21 DIAGNOSIS — E785 Hyperlipidemia, unspecified: Secondary | ICD-10-CM

## 2014-02-21 DIAGNOSIS — R002 Palpitations: Secondary | ICD-10-CM

## 2014-02-21 DIAGNOSIS — Z9989 Dependence on other enabling machines and devices: Secondary | ICD-10-CM

## 2014-02-21 DIAGNOSIS — E669 Obesity, unspecified: Secondary | ICD-10-CM

## 2014-02-21 DIAGNOSIS — K219 Gastro-esophageal reflux disease without esophagitis: Secondary | ICD-10-CM

## 2014-02-21 MED ORDER — METOPROLOL TARTRATE 25 MG PO TABS: 12.5000 mg | ORAL_TABLET | Freq: Two times a day (BID) | ORAL | Status: DC

## 2014-02-21 NOTE — Patient Instructions (Addendum)
Your physician has recommended you make the following change in your medication: START metoprolol tartrate 12.5mg  twice daily.  Your physician wants you to follow-up in: 4-6 weeks with Dr. Debara Pickett. You will receive a reminder letter in the mail two months in advance. If you don't receive a letter, please call our office to schedule the follow-up appointment.

## 2014-02-21 NOTE — Progress Notes (Signed)
OFFICE NOTE  Chief Complaint:  Chest pain, palpitations  Primary Care Physician: Gara Kroner, MD  HPI:  [name] is a pleasant 63 year old male with a number of medical problems. His past medical history is significant for hypertension, dyslipidemia, prediabetes and a strong family history of heart disease. He also has fibromyalgia and obstructive sleep apnea on CPAP. Recently he has been having pressure in his chest which is constant, sometimes worse with exertion and relieved by rest. Mostly on the left side of the chest. Occasionally has symptoms on the right side of his chest.  He also is describing some palpitations. He reportedly had these in the past and now has them several times a week. [name] had previously seen Dr. Doreatha Lew for this and was reassured. He had apparently had a remote heart catheterization which was negative. He does report significant fatigue with exertion. He also has bilateral knee pain and is generally unable to exercise on a treadmill. He is a nonsmoker.  [name] returns today for followup of his monitor and cardio metabolic testing. He underwent cardiomegaly like stress testing on 01/27/2014. He exercised for 9 minutes and stopped due to leg fatigue. There was a marked increase in heart rate to 91% max predicted. Peak V02 was 19, which is 82% predicted. The conclusion was that there was no circulatory limitation to exercise however there was a mild hypoventilatory response which is likely due to obesity and hypertensive response suggestive of deconditioning. He does have an inappropriate heart rate response which is possibly related to his fibromyalgia. His monitor demonstrated isolated PVCs but no other arrhythmias.  PMHx:  Past Medical History  Diagnosis Date  . Hyperlipidemia   . Seasonal allergies   . Irritable bowel syndrome     no current med.  . Hypertension     under control with meds., has been on med. since 2008  .  Headache(784.0)     sinus headaches; also dull headache almost every day, states related to fibromyalgia  . Sleep apnea     uses CPAP nightly  . Fibromyalgia   . Carpal tunnel syndrome of right wrist 12/2012  . Otitis media 12/25/2012    started antibiotic 12/25/2012 x 14 days    Past Surgical History  Procedure Laterality Date  . Cholecystectomy    . Knee arthroscopy Bilateral   . Nasal septum surgery    . Vasectomy    . Carpal tunnel release Left 11/28/2012    Procedure: CARPAL TUNNEL RELEASE;  Surgeon: Cammie Sickle., MD;  Location: [place];  Service: Orthopedics;  Laterality: Left;  . Carpal tunnel release Right 01/02/2013    Procedure: CARPAL TUNNEL RELEASE;  Surgeon: Cammie Sickle., MD;  Location: [place];  Service: Orthopedics;  Laterality: Right;    FAMHx:  Family History  Problem Relation Age of Onset  . Bladder Cancer Mother     bladder  . Hypertension Mother   . Heart disease Father     MI - CABG, CHF, DM, arrhythmia  . Hypertension Sister     high cholesterol   . Heart disease Sister     HTN, high cholesterol  . Heart attack Brother     half brother - CABG, CHF  . Hypertension Daughter   . Hyperlipidemia Daughter     SOCHx:   reports that he has quit smoking. He has never used smokeless tobacco. He reports that he does not drink alcohol or  use illicit drugs.  ALLERGIES:  No Known Allergies  ROS: A comprehensive review of systems was negative except for: Constitutional: positive for fatigue Respiratory: positive for cough and dyspnea on exertion Cardiovascular: positive for exertional chest pressure/discomfort and palpitations Musculoskeletal: positive for myalgias Allergic/Immunologic: positive for allergic rhinitis  HOME MEDS: Current Outpatient Prescriptions  Medication Sig Dispense Refill  . amLODipine (NORVASC) 2.5 MG tablet Take 2.5 mg by mouth daily.       . Ascorbic Acid (VITAMIN C) 1000 MG tablet Take  500 mg by mouth 2 (two) times daily.       . carisoprodol (SOMA) 350 MG tablet Take 350 mg by mouth every 12 (twelve) hours as needed.       . Cholecalciferol (VITAMIN D PO) Take 2,000 Units by mouth 2 (two) times daily.       . CYMBALTA 60 MG capsule Take 60 mg by mouth daily.       Marland Kitchen ESZOPICLONE 3 MG tablet Take 3 mg by mouth at bedtime.       . fluticasone (FLONASE) 50 MCG/ACT nasal spray       . fluticasone (VERAMYST) 27.5 MCG/SPRAY nasal spray Place 2 sprays into the nose daily.      . hydrochlorothiazide (HYDRODIURIL) 25 MG tablet       . ibuprofen (ADVIL,MOTRIN) 200 MG tablet Take 600 mg by mouth every 6 (six) hours as needed.      Marland Kitchen LYRICA 150 MG capsule Take 150 mg by mouth 2 (two) times daily.       . metoprolol tartrate (LOPRESSOR) 25 MG tablet Take 0.5 tablets (12.5 mg total) by mouth 2 (two) times daily.  30 tablet  6  . Misc Natural Products (OSTEO BI-FLEX TRIPLE STRENGTH PO) Take 2 tablets by mouth at bedtime.      . montelukast (SINGULAIR) 10 MG tablet Take 10 mg by mouth at bedtime.      . Multiple Vitamin (MULTIVITAMIN) tablet Take 1 tablet by mouth daily.      . NON FORMULARY at bedtime. CPAP      . Omega-3 Krill Oil 500 MG CAPS Take 1,000 mg by mouth daily.      Marland Kitchen omeprazole (PRILOSEC) 40 MG capsule Take 40 mg by mouth daily.      . pseudoephedrine-acetaminophen (TYLENOL SINUS) 30-500 MG TABS Take 1 tablet by mouth every 4 (four) hours as needed.      . rosuvastatin (CRESTOR) 10 MG tablet Take 10 mg by mouth daily.        No current facility-administered medications for this visit.    LABS/IMAGING: No results found for this or any previous visit (from the past 48 hour(s)). No results found.  VITALS: BP 151/87  Pulse 79  Ht 5\' 10"  (1.778 m)  Wt 232 lb (105.235 kg)  BMI 33.29 kg/m2  EXAM: deferred  EKG: deferred  ASSESSMENT: 1. Chest pressure with typical and atypical symptoms 2. Palpitations 3. Fibromyalgia 4. Hypertension 5. Uncontrolled  dyslipidemia 6. Prediabetes 7. Obstructive sleep apnea on CPAP 8. Strong family history of premature coronary artery disease  PLAN: 1.   [name] had a generally low risk exercise cardioembolic stress test. I think this is a good starting point for exercise which will be helpful for him. I would also recommend starting low-dose beta blocker which should help suppress some of his PVCs and blunt the marked increase in heart rate he gets with exertion. This could help him with his exercise tolerance. He was cautioned  that beta blockers can cause fatigue and in some patients with fibromyalgia and chronic fatigue this could worsen. He is to indicate how he feels to me after starting the medication. Finally, I suspect his triglycerides have been elevated and could be additionally treated. He reported his last triglycerides were over 300. We will get those results and I would likely recommend starting on fenofibrate 160 mg daily in addition to his Crestor.  Thank you again for the kind referral.  Pixie Casino, MD, [facility] Attending Cardiologist [facility]  Dail Meece C 02/21/2014, 1:23 PM

## 2014-04-04 ENCOUNTER — Encounter: Payer: Self-pay | Admitting: Internal Medicine

## 2014-04-04 ENCOUNTER — Ambulatory Visit (INDEPENDENT_AMBULATORY_CARE_PROVIDER_SITE_OTHER): Payer: BC Managed Care – PPO | Admitting: Internal Medicine

## 2014-04-04 VITALS — BP 134/86 | HR 65 | Ht 70.0 in | Wt 230.9 lb

## 2014-04-04 DIAGNOSIS — R002 Palpitations: Secondary | ICD-10-CM

## 2014-04-04 DIAGNOSIS — M797 Fibromyalgia: Secondary | ICD-10-CM

## 2014-04-04 DIAGNOSIS — I10 Essential (primary) hypertension: Secondary | ICD-10-CM

## 2014-04-04 NOTE — Progress Notes (Signed)
OFFICE NOTE  Chief Complaint:  Followup palpitations  Primary Care Physician: Gara Kroner, MD  HPI:  [name] is a pleasant 63 year old male with a number of medical problems. His past medical history is significant for hypertension, dyslipidemia, prediabetes and a strong family history of heart disease. He also has fibromyalgia and obstructive sleep apnea on CPAP. Recently he has been having pressure in his chest which is constant, sometimes worse with exertion and relieved by rest. Mostly on the left side of the chest. Occasionally has symptoms on the right side of his chest.  He also is describing some palpitations. He reportedly had these in the past and now has them several times a week. [name] had previously seen Dr. Doreatha Lew for this and was reassured. He had apparently had a remote heart catheterization which was negative. He does report significant fatigue with exertion. He also has bilateral knee pain and is generally unable to exercise on a treadmill. He is a nonsmoker.  [name] underwent a monitor and cardio metabolic testing. He underwent cardiomegaly like stress testing on 01/27/2014. He exercised for 9 minutes and stopped due to leg fatigue. There was a marked increase in heart rate to 91% max predicted. Peak V02 was 19, which is 82% predicted. The conclusion was that there was no circulatory limitation to exercise however there was a mild hypoventilatory response which is likely due to obesity and hypertensive response suggestive of deconditioning. He does have an inappropriate heart rate response which is possibly related to his fibromyalgia. His monitor demonstrated isolated PVCs but no other arrhythmias.  [name] returns today for follow-up. He reports she's been taking his beta blocker and is not feeling worse yet does not note a big improvement in his symptoms. He has significant fibromyalgia which has been difficult to treat. He has noted that his blood  pressure is slightly lower heart rate is lower as well which I think is advantageous.  PMHx:  Past Medical History  Diagnosis Date  . Hyperlipidemia   . Seasonal allergies   . Irritable bowel syndrome     no current med.  . Hypertension     under control with meds., has been on med. since 2008  . Headache(784.0)     sinus headaches; also dull headache almost every day, states related to fibromyalgia  . Sleep apnea     uses CPAP nightly  . Fibromyalgia   . Carpal tunnel syndrome of right wrist 12/2012  . Otitis media 12/25/2012    started antibiotic 12/25/2012 x 14 days    Past Surgical History  Procedure Laterality Date  . Cholecystectomy    . Knee arthroscopy Bilateral   . Nasal septum surgery    . Vasectomy    . Carpal tunnel release Left 11/28/2012    Procedure: CARPAL TUNNEL RELEASE;  Surgeon: Cammie Sickle., MD;  Location: [place];  Service: Orthopedics;  Laterality: Left;  . Carpal tunnel release Right 01/02/2013    Procedure: CARPAL TUNNEL RELEASE;  Surgeon: Cammie Sickle., MD;  Location: [place];  Service: Orthopedics;  Laterality: Right;    FAMHx:  Family History  Problem Relation Age of Onset  . Bladder Cancer Mother     bladder  . Hypertension Mother   . Heart disease Father     MI - CABG, CHF, DM, arrhythmia  . Hypertension Sister     high cholesterol   . Heart disease Sister  HTN, high cholesterol  . Heart attack Brother     half brother - CABG, CHF  . Hypertension Daughter   . Hyperlipidemia Daughter     SOCHx:   reports that he has quit smoking. He has never used smokeless tobacco. He reports that he does not drink alcohol or use illicit drugs.  ALLERGIES:  No Known Allergies  ROS: A comprehensive review of systems was negative except for: Constitutional: positive for fatigue Respiratory: positive for cough and dyspnea on exertion Cardiovascular: positive for exertional chest pressure/discomfort and  palpitations Musculoskeletal: positive for myalgias Allergic/Immunologic: positive for allergic rhinitis  HOME MEDS: Current Outpatient Prescriptions  Medication Sig Dispense Refill  . acetaminophen (TYLENOL) 500 MG tablet Take 1,500 mg by mouth 3 (three) times daily.    Marland Kitchen amLODipine (NORVASC) 2.5 MG tablet Take 2.5 mg by mouth daily.     . Ascorbic Acid (VITAMIN C) 1000 MG tablet Take 500 mg by mouth 2 (two) times daily.     . carisoprodol (SOMA) 350 MG tablet Take 350 mg by mouth every 12 (twelve) hours as needed.     . Cholecalciferol (VITAMIN D PO) Take 2,000 Units by mouth 2 (two) times daily.     . CYMBALTA 60 MG capsule Take 60 mg by mouth daily.     Marland Kitchen ESZOPICLONE 3 MG tablet Take 3 mg by mouth at bedtime.     . fluticasone (FLONASE) 50 MCG/ACT nasal spray     . fluticasone (VERAMYST) 27.5 MCG/SPRAY nasal spray Place 2 sprays into the nose daily.    . hydrochlorothiazide (HYDRODIURIL) 25 MG tablet     . ibuprofen (ADVIL,MOTRIN) 200 MG tablet Take 600 mg by mouth every 8 (eight) hours as needed.     Marland Kitchen LYRICA 150 MG capsule Take 150 mg by mouth 2 (two) times daily.     . metoprolol tartrate (LOPRESSOR) 25 MG tablet Take 0.5 tablets (12.5 mg total) by mouth 2 (two) times daily. 30 tablet 6  . Misc Natural Products (OSTEO BI-FLEX TRIPLE STRENGTH PO) Take 2 tablets by mouth at bedtime.    . montelukast (SINGULAIR) 10 MG tablet Take 10 mg by mouth at bedtime.    . Multiple Vitamin (MULTIVITAMIN) tablet Take 1 tablet by mouth daily.    . NON FORMULARY at bedtime. CPAP    . Omega-3 Krill Oil 500 MG CAPS Take 1,000 mg by mouth daily.    Marland Kitchen omeprazole (PRILOSEC) 40 MG capsule Take 40 mg by mouth daily.    . pseudoephedrine-acetaminophen (TYLENOL SINUS) 30-500 MG TABS Take 1 tablet by mouth every 4 (four) hours as needed.    . rosuvastatin (CRESTOR) 10 MG tablet Take 10 mg by mouth daily.      No current facility-administered medications for this visit.    LABS/IMAGING: No results found  for this or any previous visit (from the past 48 hour(s)). No results found.  VITALS: BP 134/86 mmHg  Pulse 65  Ht 5\' 10"  (1.778 m)  Wt 230 lb 14.4 oz (104.736 kg)  BMI 33.13 kg/m2  EXAM: deferred  EKG: deferred  ASSESSMENT: 1. Chest pressure with typical and atypical symptoms 2. Palpitations 3. Fibromyalgia 4. Hypertension 5. Uncontrolled dyslipidemia 6. Prediabetes 7. Obstructive sleep apnea on CPAP 8. Strong family history of premature coronary artery disease  PLAN: 1.   [name] seems to be tolerating the addition of beta blocker. Blood pressure and heart rate are better. He is not reporting any significant palpitations so he may be benefiting  from the beta blocker. Unfortunately he has severe fibromyalgia which has been difficult to treat. We discussed cardiac follow-up and as long as he continues to aggressively treat his risk factors, I'm happy to see him back as needed. He will need follow-up of his lipid profile and may need treatment of elevated triglycerides which she had in the past. He said that he would be sending me information regarding his triglycerides.  Thank you again for the kind referral.  Pixie Casino, MD, [facility] Attending Cardiologist [facility]  Osualdo Hansell C 04/04/2014, 8:18 AM

## 2014-04-04 NOTE — Patient Instructions (Signed)
Your physician recommends that you schedule a follow-up appointment as needed  

## 2014-06-28 ENCOUNTER — Encounter: Payer: Self-pay | Admitting: Internal Medicine

## 2014-07-01 ENCOUNTER — Encounter: Payer: Self-pay | Admitting: *Deleted

## 2014-07-01 ENCOUNTER — Other Ambulatory Visit: Payer: Self-pay | Admitting: *Deleted

## 2014-07-01 DIAGNOSIS — Z79899 Other long term (current) drug therapy: Secondary | ICD-10-CM

## 2014-07-01 DIAGNOSIS — E785 Hyperlipidemia, unspecified: Secondary | ICD-10-CM

## 2014-07-01 MED ORDER — FENOFIBRATE 160 MG PO TABS: 160.0000 mg | ORAL_TABLET | Freq: Every day | ORAL | Status: DC

## 2014-09-15 ENCOUNTER — Other Ambulatory Visit: Payer: Self-pay | Admitting: Internal Medicine

## 2014-09-16 NOTE — Telephone Encounter (Signed)
Rx has been sent to the pharmacy electronically. ° °

## 2014-09-30 ENCOUNTER — Other Ambulatory Visit: Payer: Self-pay | Admitting: Family Medicine

## 2014-09-30 ENCOUNTER — Ambulatory Visit
Admission: RE | Admit: 2014-09-30 | Discharge: 2014-09-30 | Disposition: A | Discharge status: Home / Self Care | Payer: 59 | Source: Ambulatory Visit | Attending: Family Medicine | Admitting: Family Medicine

## 2014-09-30 DIAGNOSIS — R109 Unspecified abdominal pain: Secondary | ICD-10-CM

## 2014-10-03 ENCOUNTER — Encounter: Payer: Self-pay | Admitting: Internal Medicine

## 2015-01-16 ENCOUNTER — Other Ambulatory Visit: Payer: Self-pay | Admitting: Internal Medicine

## 2015-01-16 NOTE — Telephone Encounter (Signed)
REFILL 

## 2015-01-22 ENCOUNTER — Other Ambulatory Visit: Payer: Self-pay | Admitting: Internal Medicine

## 2015-01-22 NOTE — Telephone Encounter (Signed)
Rx(s) sent to pharmacy electronically.  

## 2015-01-29 ENCOUNTER — Encounter: Payer: Self-pay | Admitting: *Deleted

## 2015-10-15 IMAGING — CR DG ABDOMEN 2V
3 series · 3 of 3 positions shown · non-contrast
Comparison: None.

CLINICAL DATA: Left flank pain

EXAM:
ABDOMEN - 2 VIEW

[w abdomen upright *]
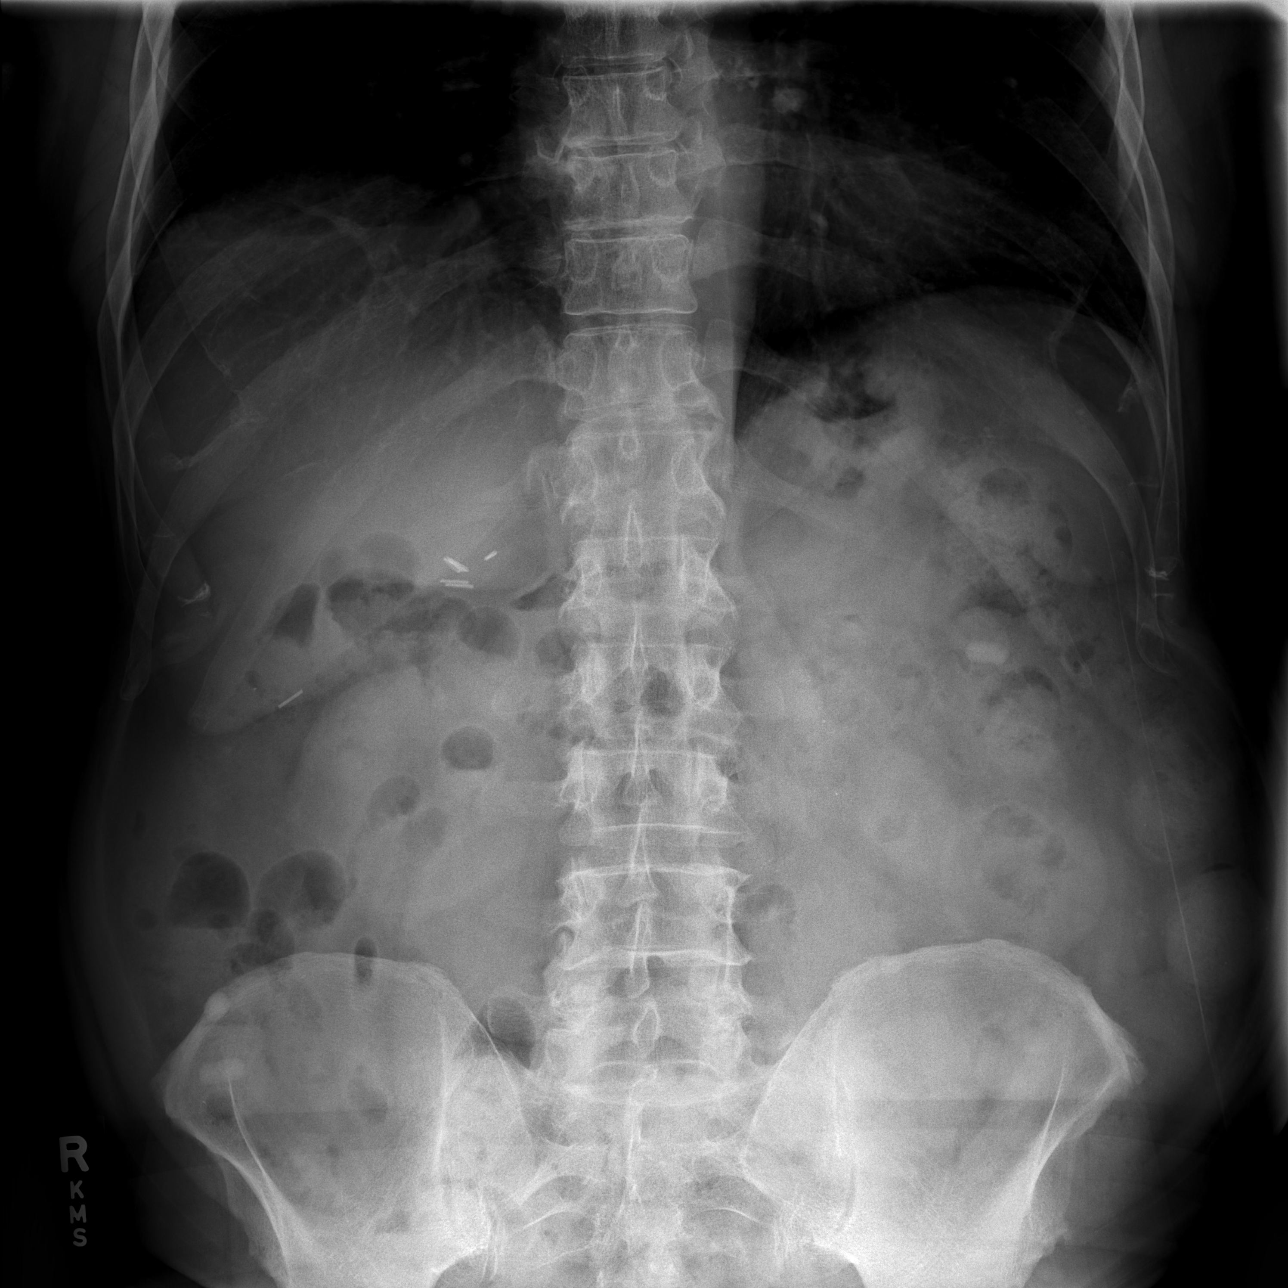

[t abdomen supine (1 of 2)]
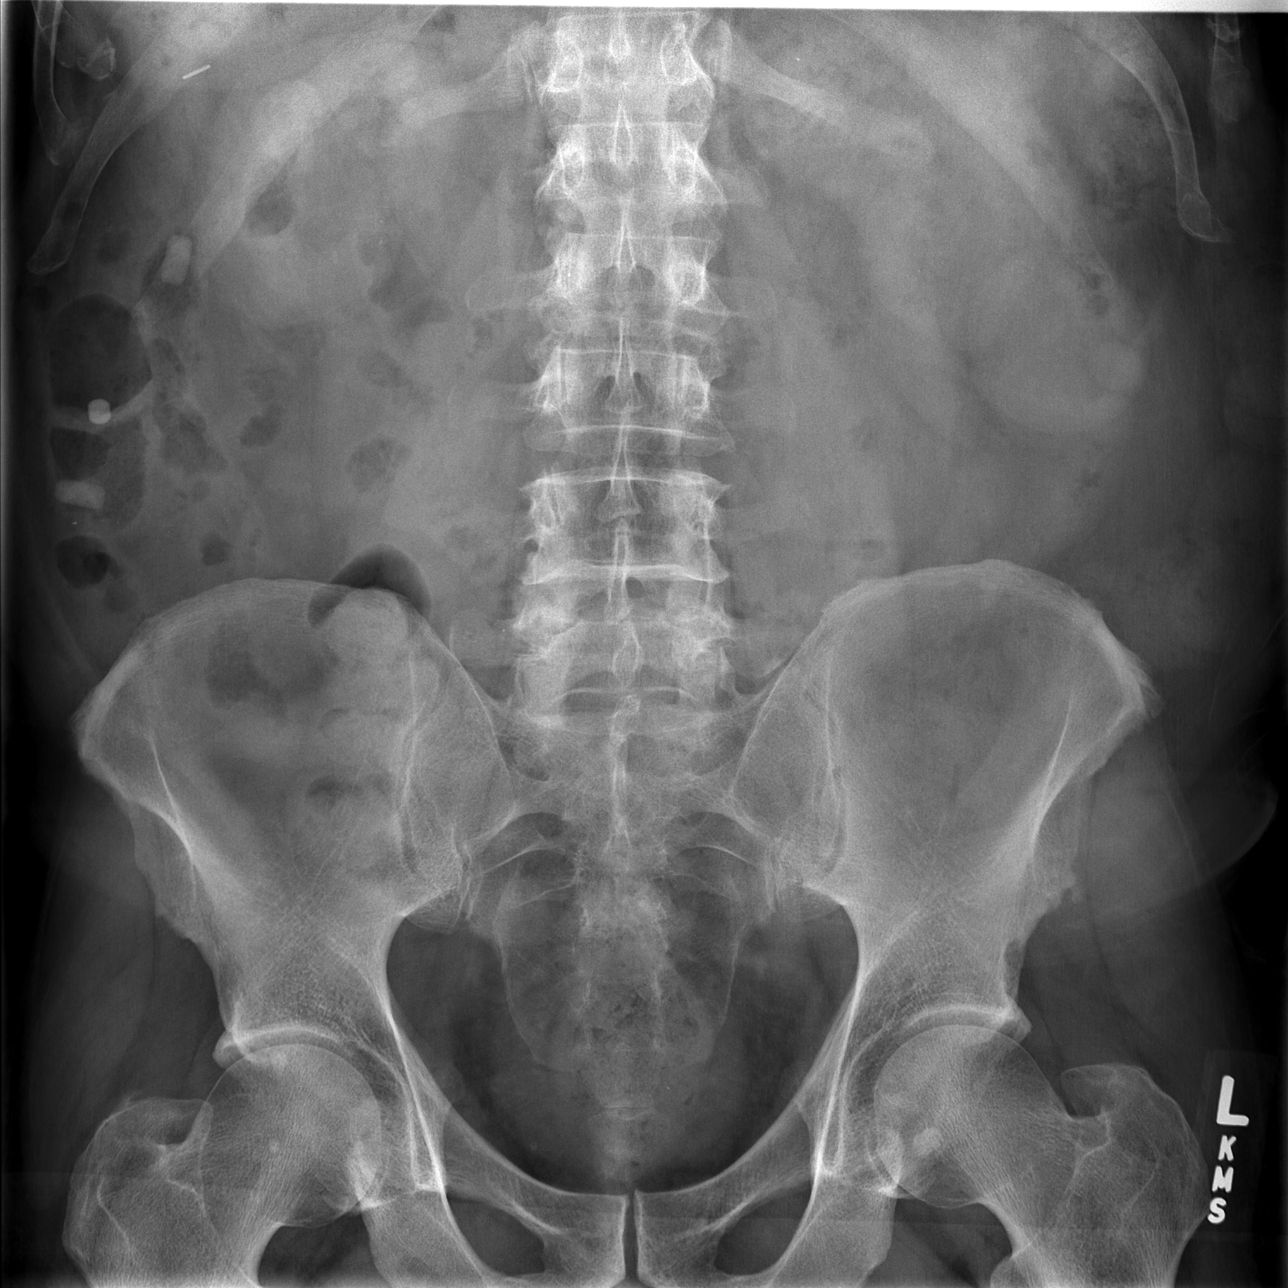

[t abdomen supine (2 of 2)]
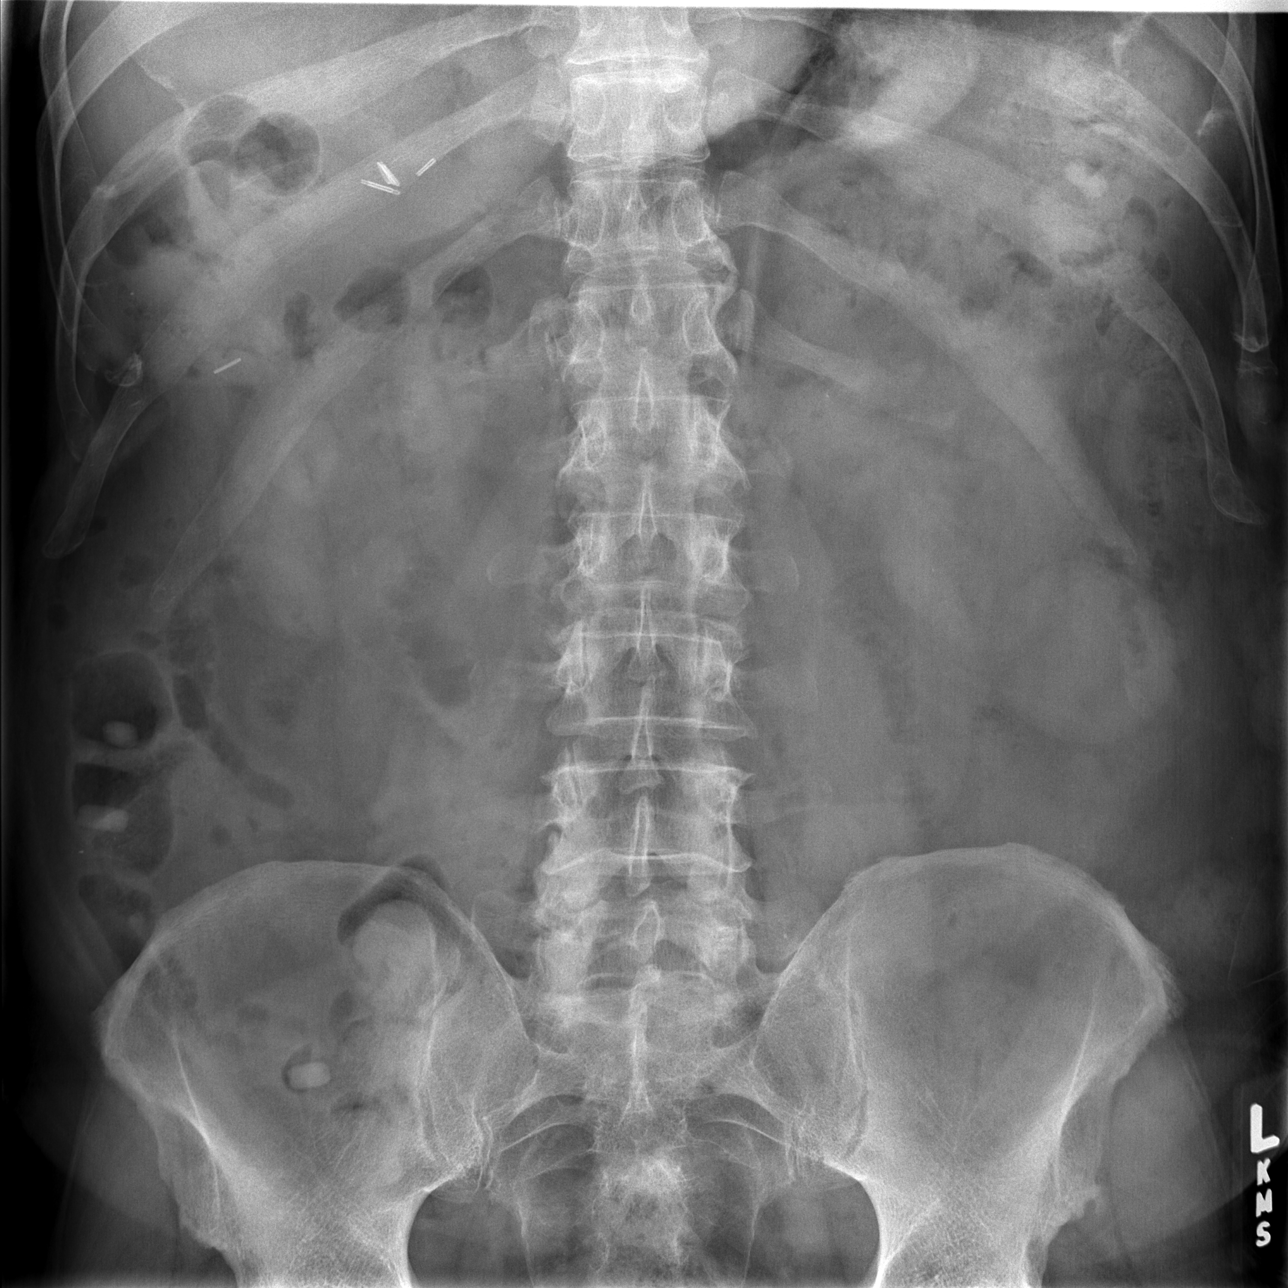

[3 of 3 positions shown; findings below may reference images not displayed]

FINDINGS: Normal bowel gas pattern. No bowel obstruction or free air. No
air-fluid levels in the bowel. Undigested tablets in the colon.
Normal amount of stool in the colon.

Surgical clips in the gallbladder fossa. No renal calculi. No bony
lesion.
IMPRESSION: Negative.

## 2016-07-30 ENCOUNTER — Other Ambulatory Visit: Payer: Self-pay | Admitting: Family Medicine

## 2016-07-30 ENCOUNTER — Ambulatory Visit
Admission: RE | Admit: 2016-07-30 | Discharge: 2016-07-30 | Disposition: A | Discharge status: Home / Self Care | Payer: BLUE CROSS/BLUE SHIELD | Source: Ambulatory Visit | Attending: Family Medicine | Admitting: Family Medicine

## 2016-07-30 DIAGNOSIS — Z77090 Contact with and (suspected) exposure to asbestos: Secondary | ICD-10-CM

## 2017-01-12 DIAGNOSIS — H43393 Other vitreous opacities, bilateral: Secondary | ICD-10-CM | POA: Diagnosis not present

## 2017-01-12 DIAGNOSIS — H10413 Chronic giant papillary conjunctivitis, bilateral: Secondary | ICD-10-CM | POA: Diagnosis not present

## 2017-01-27 DIAGNOSIS — E782 Mixed hyperlipidemia: Secondary | ICD-10-CM | POA: Diagnosis not present

## 2017-01-27 DIAGNOSIS — R7303 Prediabetes: Secondary | ICD-10-CM | POA: Diagnosis not present

## 2017-01-27 DIAGNOSIS — J309 Allergic rhinitis, unspecified: Secondary | ICD-10-CM | POA: Diagnosis not present

## 2017-01-27 DIAGNOSIS — M797 Fibromyalgia: Secondary | ICD-10-CM | POA: Diagnosis not present

## 2017-01-27 DIAGNOSIS — E559 Vitamin D deficiency, unspecified: Secondary | ICD-10-CM | POA: Diagnosis not present

## 2017-01-27 DIAGNOSIS — Z77090 Contact with and (suspected) exposure to asbestos: Secondary | ICD-10-CM | POA: Diagnosis not present

## 2017-01-27 DIAGNOSIS — G4730 Sleep apnea, unspecified: Secondary | ICD-10-CM | POA: Diagnosis not present

## 2017-01-27 DIAGNOSIS — Z23 Encounter for immunization: Secondary | ICD-10-CM | POA: Diagnosis not present

## 2017-01-27 DIAGNOSIS — I10 Essential (primary) hypertension: Secondary | ICD-10-CM | POA: Diagnosis not present

## 2017-01-27 DIAGNOSIS — K219 Gastro-esophageal reflux disease without esophagitis: Secondary | ICD-10-CM | POA: Diagnosis not present

## 2017-01-27 DIAGNOSIS — G4700 Insomnia, unspecified: Secondary | ICD-10-CM | POA: Diagnosis not present

## 2017-06-16 DIAGNOSIS — K582 Mixed irritable bowel syndrome: Secondary | ICD-10-CM | POA: Diagnosis not present

## 2017-08-14 IMAGING — CR DG CHEST 2V
2 series · 2 of 2 positions shown · non-contrast
Comparison: 04/02/2003

CLINICAL DATA: Asbestos exposure several years ago with shortness
of Breath

EXAM:
CHEST  2 VIEW

[w chest pa]
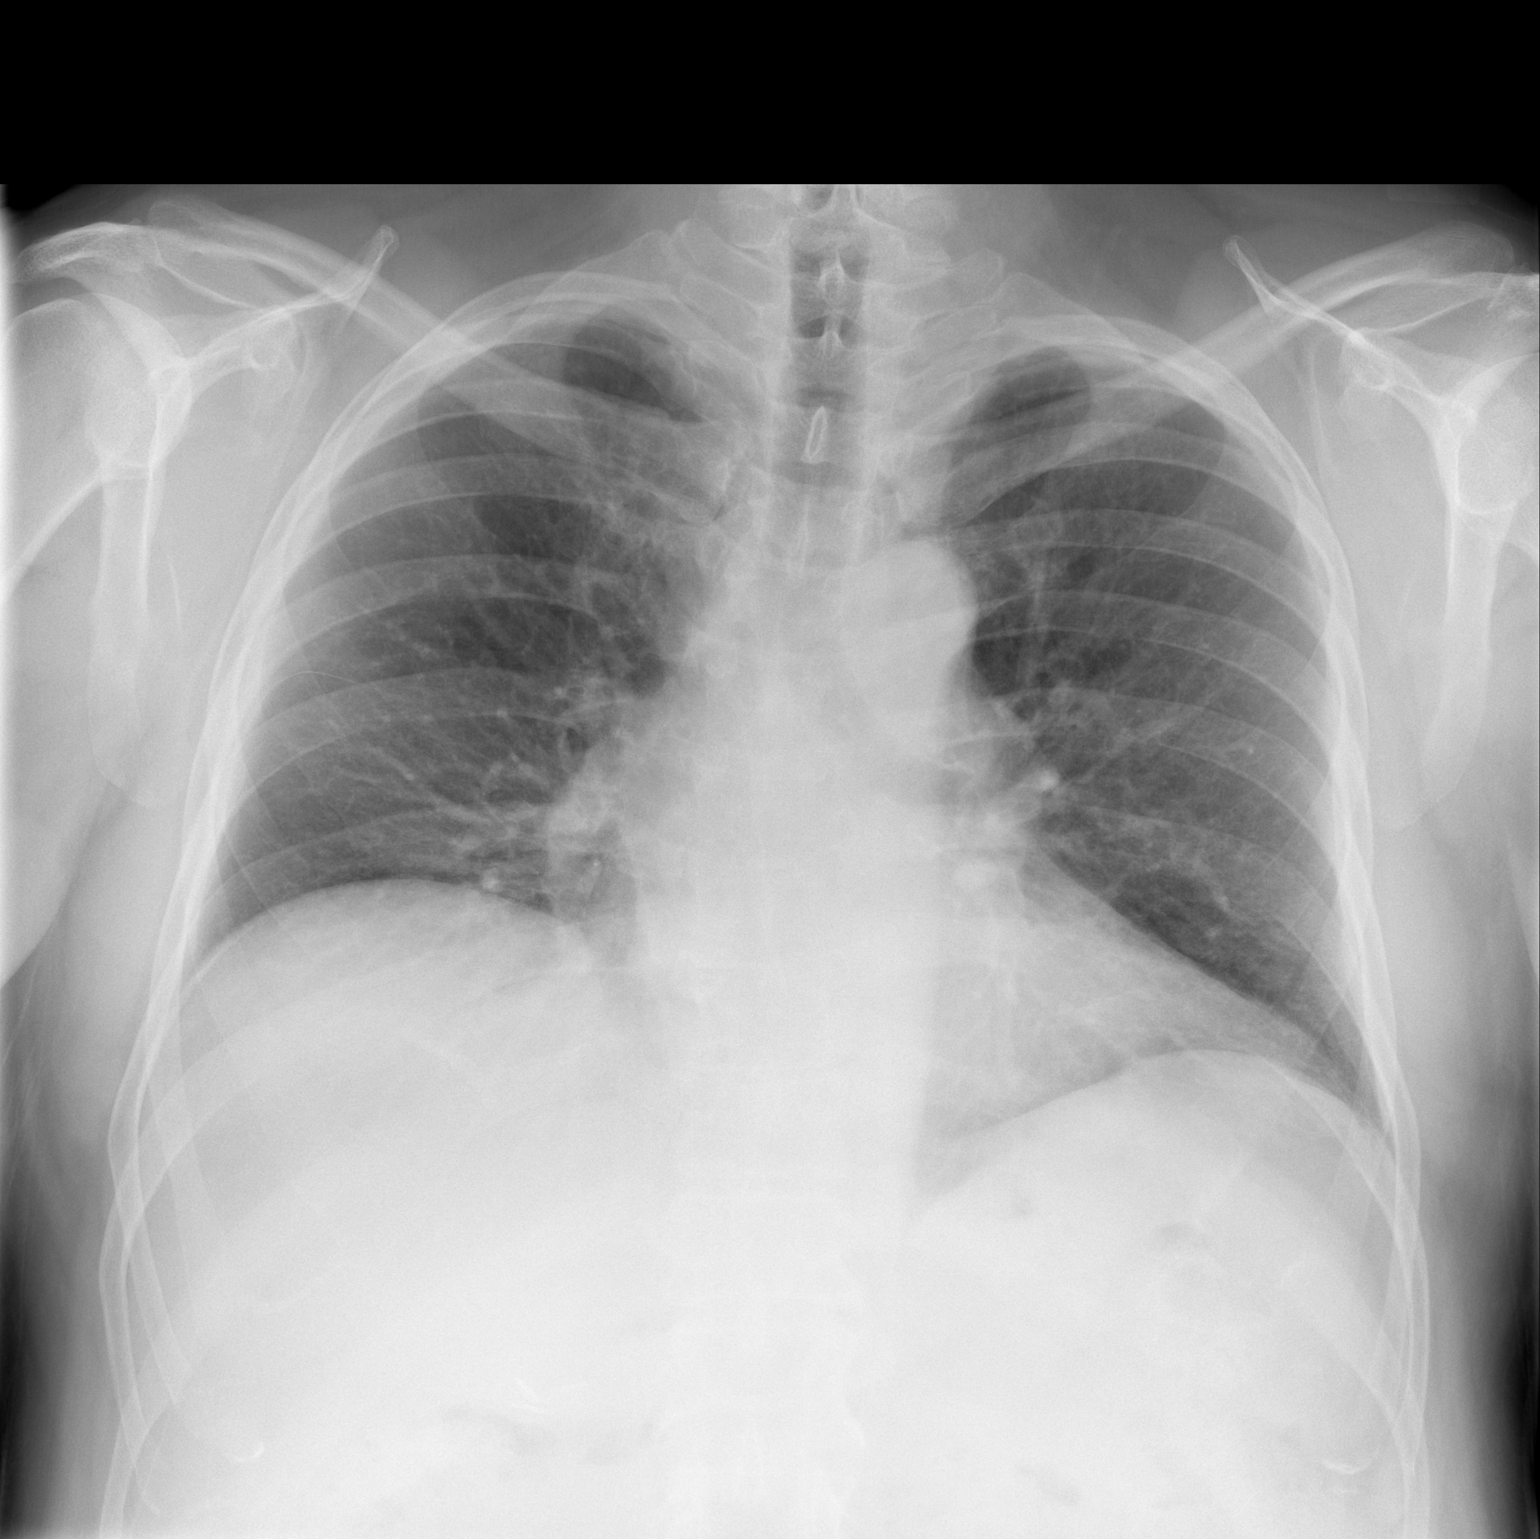

[w chest lat]
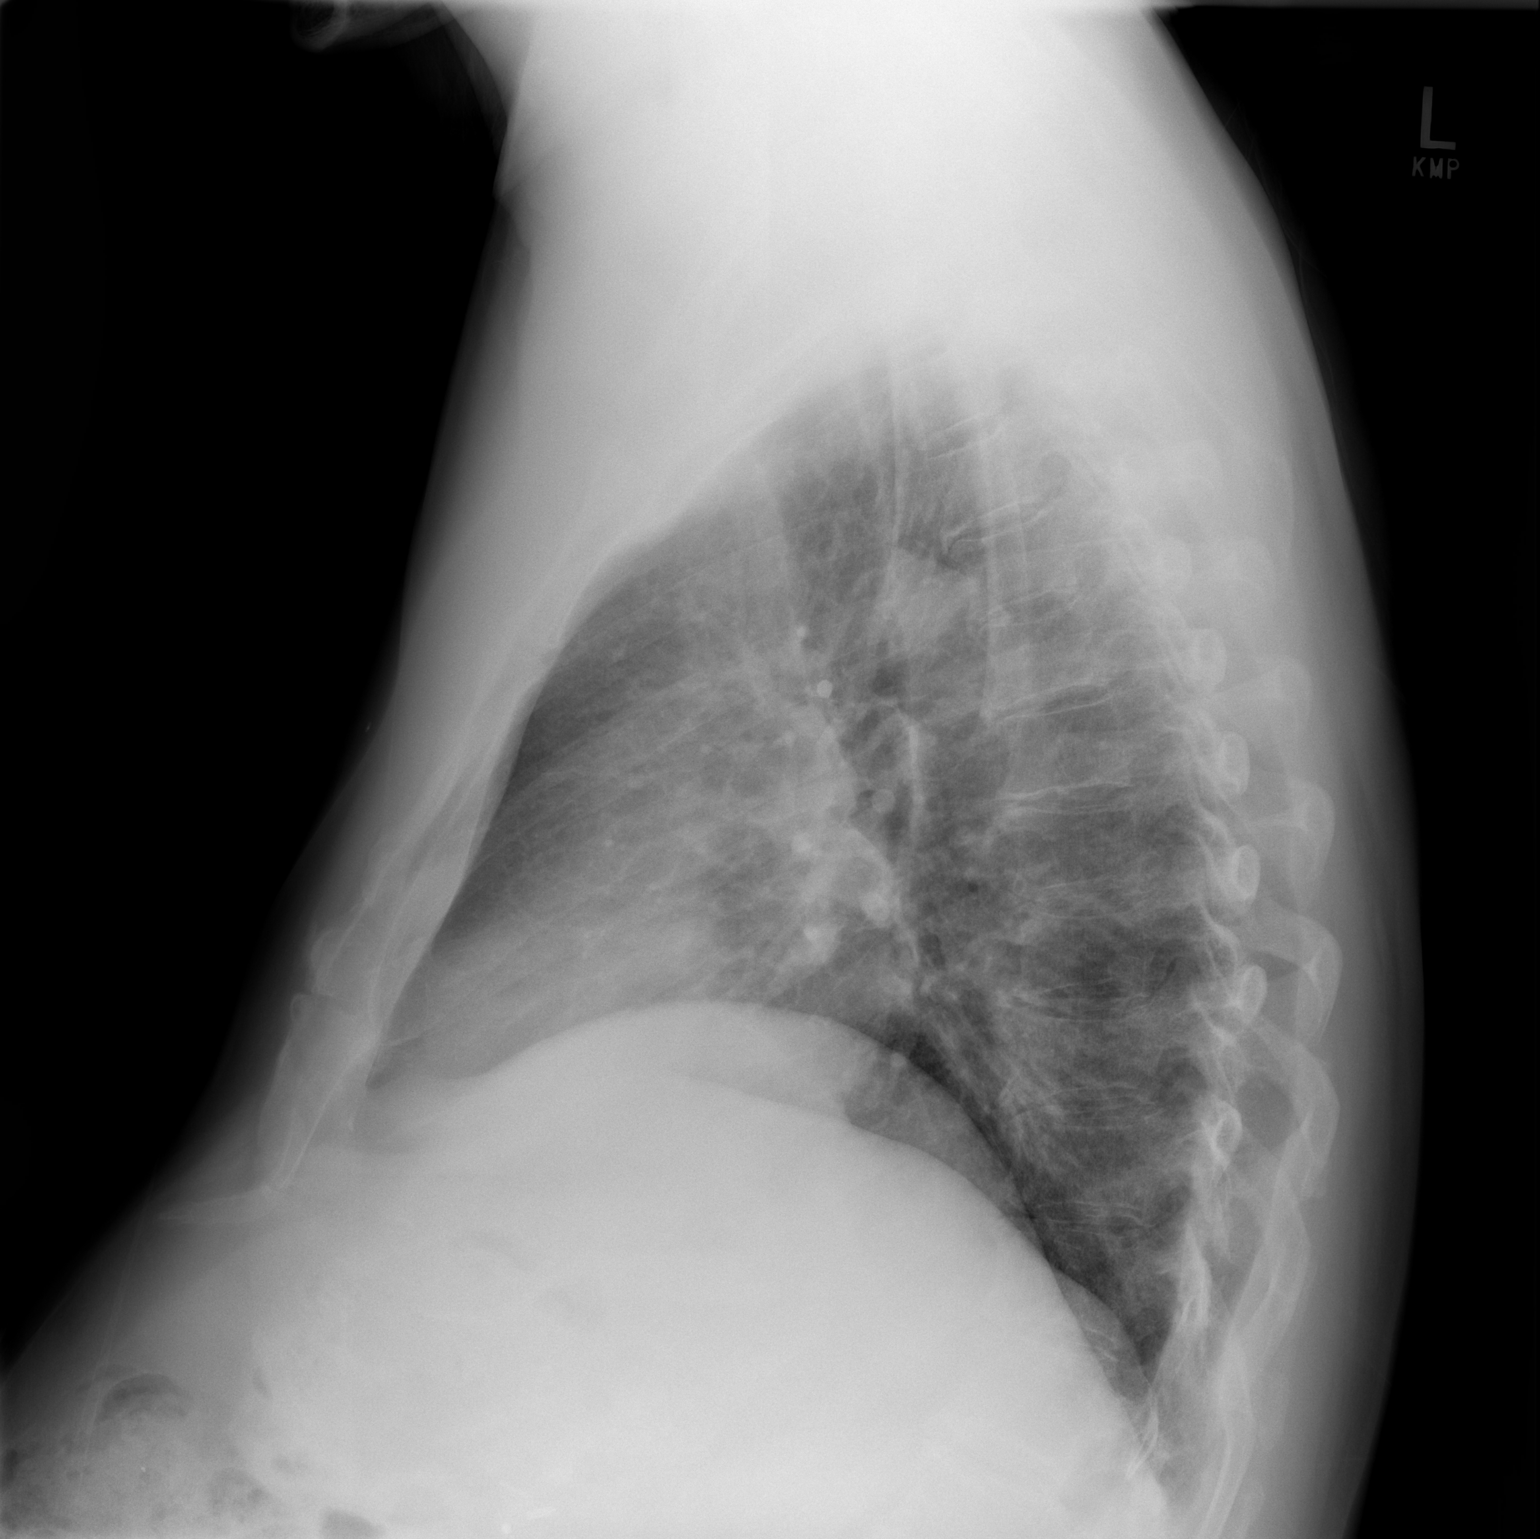

[2 of 2 positions shown; findings below may reference images not displayed]

FINDINGS: Cardiac shadow is mildly enlarged. No focal infiltrate or sizable
effusion is seen. No pleural plaquing is identified. No bony
abnormality is seen.
IMPRESSION: No active cardiopulmonary disease.

## 2017-08-18 DIAGNOSIS — E559 Vitamin D deficiency, unspecified: Secondary | ICD-10-CM | POA: Diagnosis not present

## 2017-08-18 DIAGNOSIS — G4700 Insomnia, unspecified: Secondary | ICD-10-CM | POA: Diagnosis not present

## 2017-08-18 DIAGNOSIS — G4730 Sleep apnea, unspecified: Secondary | ICD-10-CM | POA: Diagnosis not present

## 2017-08-18 DIAGNOSIS — I10 Essential (primary) hypertension: Secondary | ICD-10-CM | POA: Diagnosis not present

## 2017-08-18 DIAGNOSIS — R7303 Prediabetes: Secondary | ICD-10-CM | POA: Diagnosis not present

## 2017-08-18 DIAGNOSIS — K219 Gastro-esophageal reflux disease without esophagitis: Secondary | ICD-10-CM | POA: Diagnosis not present

## 2017-08-18 DIAGNOSIS — M797 Fibromyalgia: Secondary | ICD-10-CM | POA: Diagnosis not present

## 2017-08-18 DIAGNOSIS — E782 Mixed hyperlipidemia: Secondary | ICD-10-CM | POA: Diagnosis not present

## 2017-08-18 DIAGNOSIS — J309 Allergic rhinitis, unspecified: Secondary | ICD-10-CM | POA: Diagnosis not present

## 2018-01-13 DIAGNOSIS — R609 Edema, unspecified: Secondary | ICD-10-CM | POA: Diagnosis not present

## 2018-01-13 DIAGNOSIS — L989 Disorder of the skin and subcutaneous tissue, unspecified: Secondary | ICD-10-CM | POA: Diagnosis not present

## 2018-01-13 DIAGNOSIS — K580 Irritable bowel syndrome with diarrhea: Secondary | ICD-10-CM | POA: Diagnosis not present

## 2018-01-13 DIAGNOSIS — M545 Low back pain: Secondary | ICD-10-CM | POA: Diagnosis not present

## 2018-01-16 DIAGNOSIS — M4696 Unspecified inflammatory spondylopathy, lumbar region: Secondary | ICD-10-CM | POA: Diagnosis not present

## 2018-01-16 DIAGNOSIS — M545 Low back pain: Secondary | ICD-10-CM | POA: Diagnosis not present

## 2018-01-21 DIAGNOSIS — M545 Low back pain: Secondary | ICD-10-CM | POA: Diagnosis not present

## 2018-01-30 DIAGNOSIS — D2261 Melanocytic nevi of right upper limb, including shoulder: Secondary | ICD-10-CM | POA: Diagnosis not present

## 2018-01-30 DIAGNOSIS — D2372 Other benign neoplasm of skin of left lower limb, including hip: Secondary | ICD-10-CM | POA: Diagnosis not present

## 2018-01-30 DIAGNOSIS — L570 Actinic keratosis: Secondary | ICD-10-CM | POA: Diagnosis not present

## 2018-01-30 DIAGNOSIS — L565 Disseminated superficial actinic porokeratosis (DSAP): Secondary | ICD-10-CM | POA: Diagnosis not present

## 2018-01-30 DIAGNOSIS — D2262 Melanocytic nevi of left upper limb, including shoulder: Secondary | ICD-10-CM | POA: Diagnosis not present

## 2018-01-30 DIAGNOSIS — L821 Other seborrheic keratosis: Secondary | ICD-10-CM | POA: Diagnosis not present

## 2018-02-06 DIAGNOSIS — G8929 Other chronic pain: Secondary | ICD-10-CM | POA: Diagnosis not present

## 2018-02-17 DIAGNOSIS — M545 Low back pain: Secondary | ICD-10-CM | POA: Diagnosis not present

## 2018-02-20 DIAGNOSIS — M545 Low back pain: Secondary | ICD-10-CM | POA: Diagnosis not present

## 2018-02-23 DIAGNOSIS — M5136 Other intervertebral disc degeneration, lumbar region: Secondary | ICD-10-CM | POA: Diagnosis not present

## 2018-02-27 DIAGNOSIS — M545 Low back pain: Secondary | ICD-10-CM | POA: Diagnosis not present

## 2018-03-02 DIAGNOSIS — M545 Low back pain: Secondary | ICD-10-CM | POA: Diagnosis not present

## 2018-03-06 DIAGNOSIS — M545 Low back pain: Secondary | ICD-10-CM | POA: Diagnosis not present

## 2018-03-09 DIAGNOSIS — M545 Low back pain: Secondary | ICD-10-CM | POA: Diagnosis not present

## 2018-03-13 DIAGNOSIS — M545 Low back pain: Secondary | ICD-10-CM | POA: Diagnosis not present

## 2018-03-16 DIAGNOSIS — M545 Low back pain: Secondary | ICD-10-CM | POA: Diagnosis not present

## 2018-03-23 DIAGNOSIS — Z125 Encounter for screening for malignant neoplasm of prostate: Secondary | ICD-10-CM | POA: Diagnosis not present

## 2018-03-23 DIAGNOSIS — Z1389 Encounter for screening for other disorder: Secondary | ICD-10-CM | POA: Diagnosis not present

## 2018-03-23 DIAGNOSIS — I10 Essential (primary) hypertension: Secondary | ICD-10-CM | POA: Diagnosis not present

## 2018-03-23 DIAGNOSIS — M545 Low back pain: Secondary | ICD-10-CM | POA: Diagnosis not present

## 2018-03-23 DIAGNOSIS — Z23 Encounter for immunization: Secondary | ICD-10-CM | POA: Diagnosis not present

## 2018-03-23 DIAGNOSIS — G4700 Insomnia, unspecified: Secondary | ICD-10-CM | POA: Diagnosis not present

## 2018-03-23 DIAGNOSIS — E559 Vitamin D deficiency, unspecified: Secondary | ICD-10-CM | POA: Diagnosis not present

## 2018-03-23 DIAGNOSIS — Z0000 Encounter for general adult medical examination without abnormal findings: Secondary | ICD-10-CM | POA: Diagnosis not present

## 2018-03-23 DIAGNOSIS — M797 Fibromyalgia: Secondary | ICD-10-CM | POA: Diagnosis not present

## 2018-03-23 DIAGNOSIS — Z1211 Encounter for screening for malignant neoplasm of colon: Secondary | ICD-10-CM | POA: Diagnosis not present

## 2018-03-23 DIAGNOSIS — R7303 Prediabetes: Secondary | ICD-10-CM | POA: Diagnosis not present

## 2018-03-23 DIAGNOSIS — E782 Mixed hyperlipidemia: Secondary | ICD-10-CM | POA: Diagnosis not present

## 2018-03-23 DIAGNOSIS — K219 Gastro-esophageal reflux disease without esophagitis: Secondary | ICD-10-CM | POA: Diagnosis not present

## 2018-03-24 ENCOUNTER — Other Ambulatory Visit: Payer: Self-pay | Admitting: Family Medicine

## 2018-03-24 DIAGNOSIS — Z136 Encounter for screening for cardiovascular disorders: Secondary | ICD-10-CM

## 2018-03-31 ENCOUNTER — Ambulatory Visit
Admission: RE | Admit: 2018-03-31 | Discharge: 2018-03-31 | Disposition: A | Discharge status: Home / Self Care | Payer: BLUE CROSS/BLUE SHIELD | Source: Ambulatory Visit | Attending: Family Medicine | Admitting: Family Medicine

## 2018-03-31 DIAGNOSIS — Z136 Encounter for screening for cardiovascular disorders: Secondary | ICD-10-CM

## 2018-03-31 DIAGNOSIS — Z87891 Personal history of nicotine dependence: Secondary | ICD-10-CM | POA: Diagnosis not present

## 2018-04-04 DIAGNOSIS — M797 Fibromyalgia: Secondary | ICD-10-CM | POA: Diagnosis not present

## 2018-04-04 DIAGNOSIS — G8929 Other chronic pain: Secondary | ICD-10-CM | POA: Diagnosis not present

## 2018-05-29 DIAGNOSIS — B1920 Unspecified viral hepatitis C without hepatic coma: Secondary | ICD-10-CM | POA: Diagnosis not present

## 2018-05-29 DIAGNOSIS — B1910 Unspecified viral hepatitis B without hepatic coma: Secondary | ICD-10-CM | POA: Diagnosis not present

## 2018-05-29 DIAGNOSIS — Z1159 Encounter for screening for other viral diseases: Secondary | ICD-10-CM | POA: Diagnosis not present

## 2018-05-29 DIAGNOSIS — K739 Chronic hepatitis, unspecified: Secondary | ICD-10-CM | POA: Diagnosis not present

## 2018-06-26 DIAGNOSIS — K582 Mixed irritable bowel syndrome: Secondary | ICD-10-CM | POA: Diagnosis not present

## 2018-09-21 DIAGNOSIS — R7303 Prediabetes: Secondary | ICD-10-CM | POA: Diagnosis not present

## 2018-09-21 DIAGNOSIS — K219 Gastro-esophageal reflux disease without esophagitis: Secondary | ICD-10-CM | POA: Diagnosis not present

## 2018-09-21 DIAGNOSIS — I10 Essential (primary) hypertension: Secondary | ICD-10-CM | POA: Diagnosis not present

## 2018-09-21 DIAGNOSIS — J309 Allergic rhinitis, unspecified: Secondary | ICD-10-CM | POA: Diagnosis not present

## 2018-09-21 DIAGNOSIS — M545 Low back pain: Secondary | ICD-10-CM | POA: Diagnosis not present

## 2018-09-21 DIAGNOSIS — E559 Vitamin D deficiency, unspecified: Secondary | ICD-10-CM | POA: Diagnosis not present

## 2018-09-21 DIAGNOSIS — E782 Mixed hyperlipidemia: Secondary | ICD-10-CM | POA: Diagnosis not present

## 2018-09-21 DIAGNOSIS — M797 Fibromyalgia: Secondary | ICD-10-CM | POA: Diagnosis not present

## 2018-09-21 DIAGNOSIS — G4700 Insomnia, unspecified: Secondary | ICD-10-CM | POA: Diagnosis not present

## 2018-09-28 DIAGNOSIS — R7303 Prediabetes: Secondary | ICD-10-CM | POA: Diagnosis not present

## 2018-09-28 DIAGNOSIS — E782 Mixed hyperlipidemia: Secondary | ICD-10-CM | POA: Diagnosis not present

## 2019-02-13 DIAGNOSIS — M797 Fibromyalgia: Secondary | ICD-10-CM | POA: Diagnosis not present

## 2019-02-13 DIAGNOSIS — I10 Essential (primary) hypertension: Secondary | ICD-10-CM | POA: Diagnosis not present

## 2019-02-13 DIAGNOSIS — B880 Other acariasis: Secondary | ICD-10-CM | POA: Diagnosis not present

## 2019-03-27 DIAGNOSIS — Z23 Encounter for immunization: Secondary | ICD-10-CM | POA: Diagnosis not present

## 2019-04-11 DIAGNOSIS — H524 Presbyopia: Secondary | ICD-10-CM | POA: Diagnosis not present

## 2019-04-15 IMAGING — US US ABDOMINAL AORTA SCREENING AAA
1 series · 14 of 22 positions shown · non-contrast
Comparison: None.

CLINICAL DATA: 67-year-old male with smoking history.

EXAM:
US ABDOMINAL AORTA MEDICARE SCREENING
TECHNIQUE: Ultrasound examination of the abdominal aorta was performed as a
screening evaluation for abdominal aortic aneurysm.

[Series 1: us abdominal aorta screening aaa · 0.31mm/px · 14 of 22 slices shown]
[im 1/22]
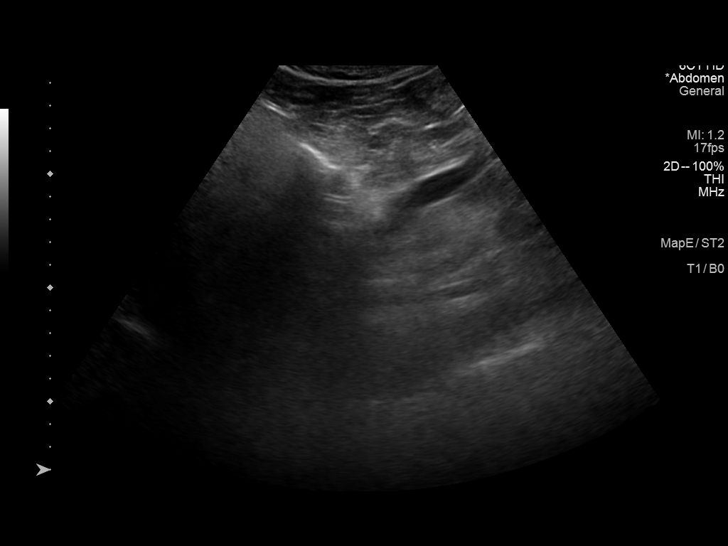
[im 3/22]
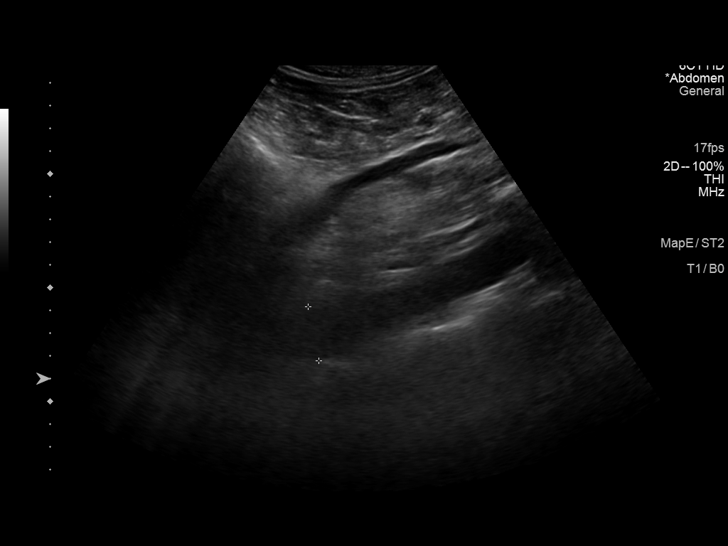
[im 4/22]
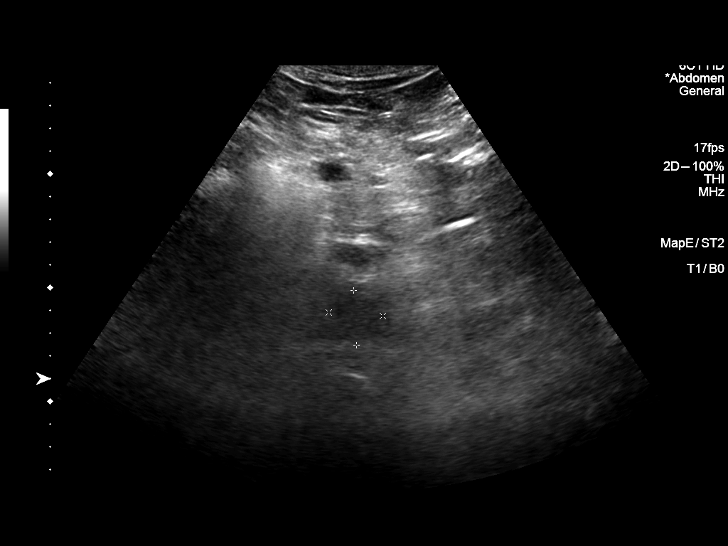
[im 6/22]
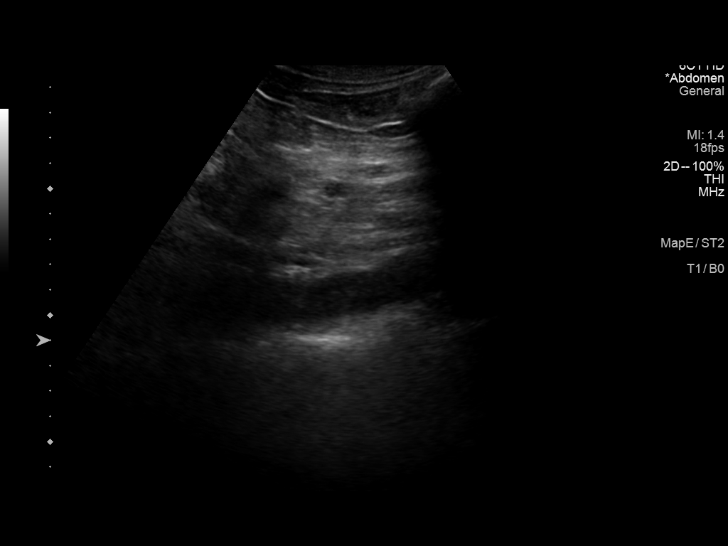
[im 8/22]
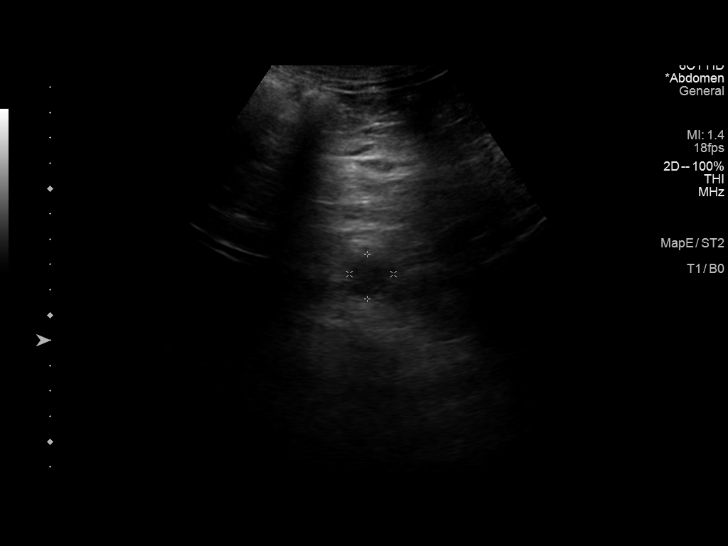
[im 9/22]
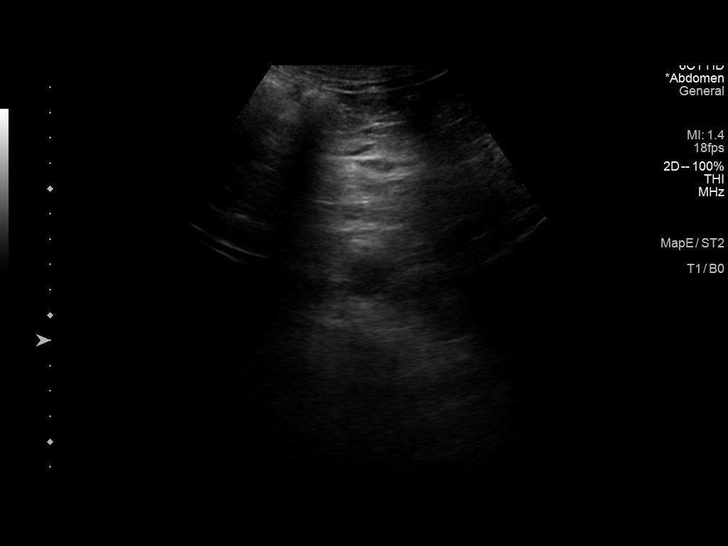
[im 11/22]
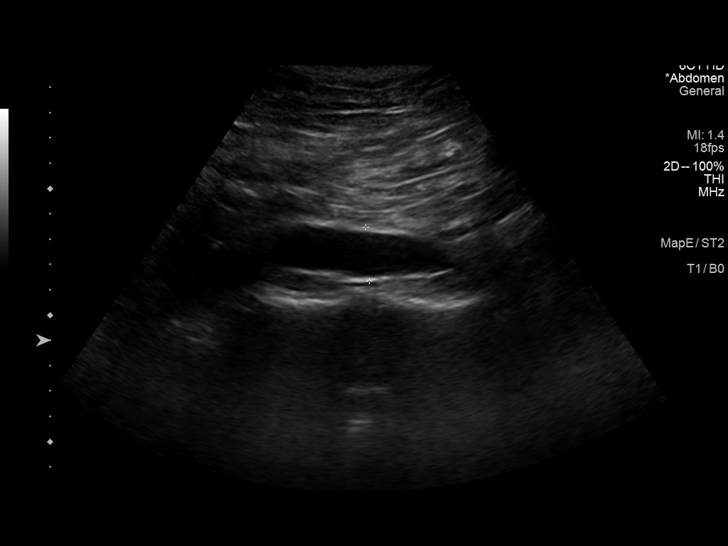
[im 12/22]
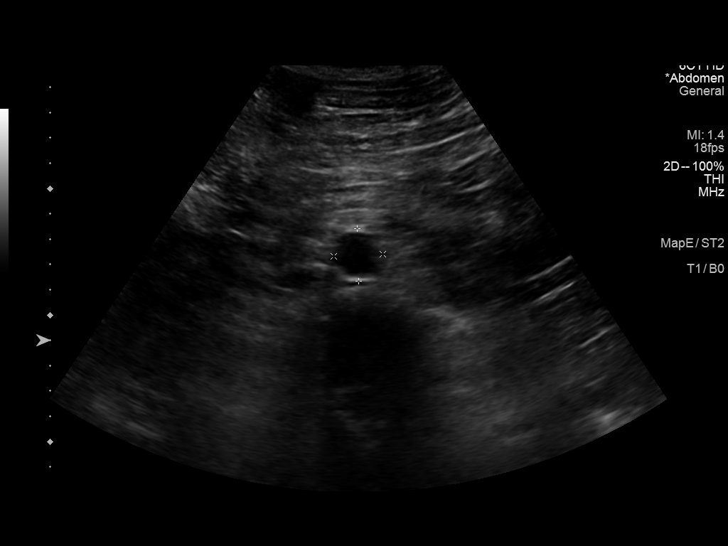
[im 14/22]
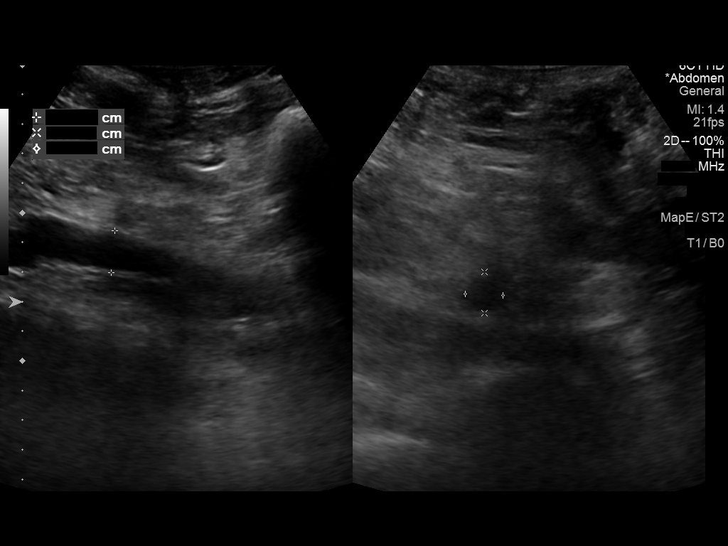
[im 15/22]
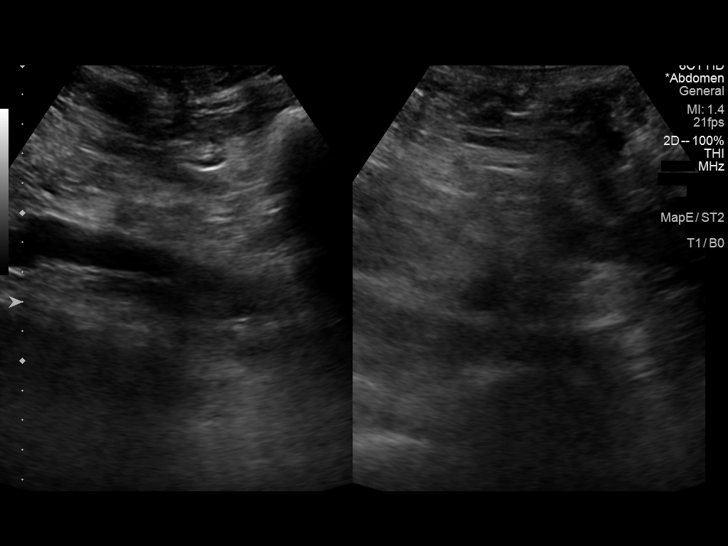
[im 17/22]
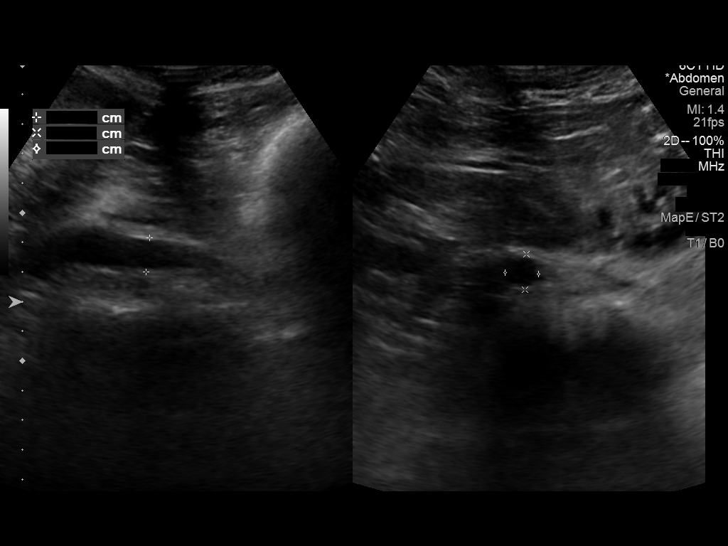
[im 19/22]
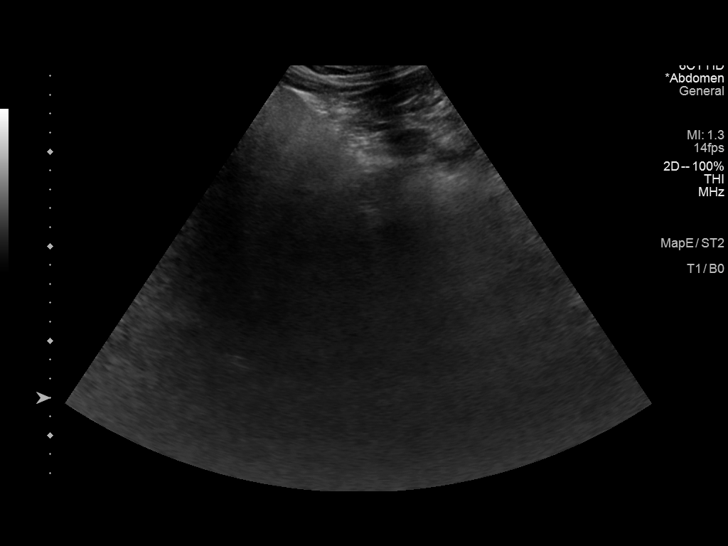
[im 20/22]
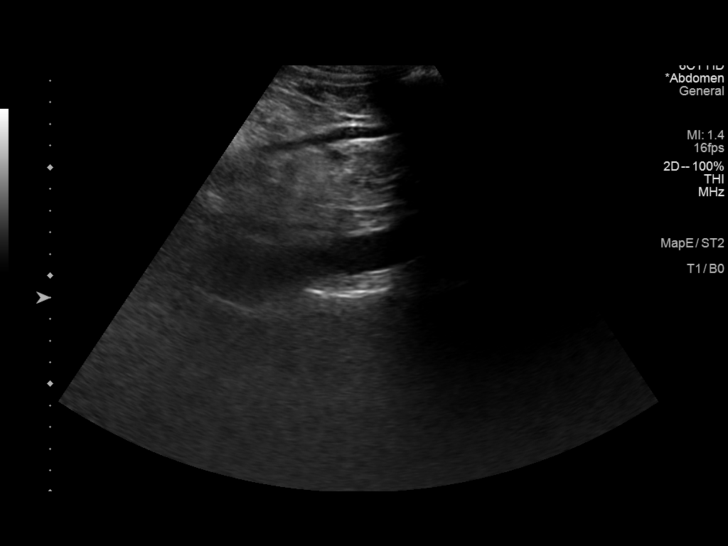
[im 22/22]
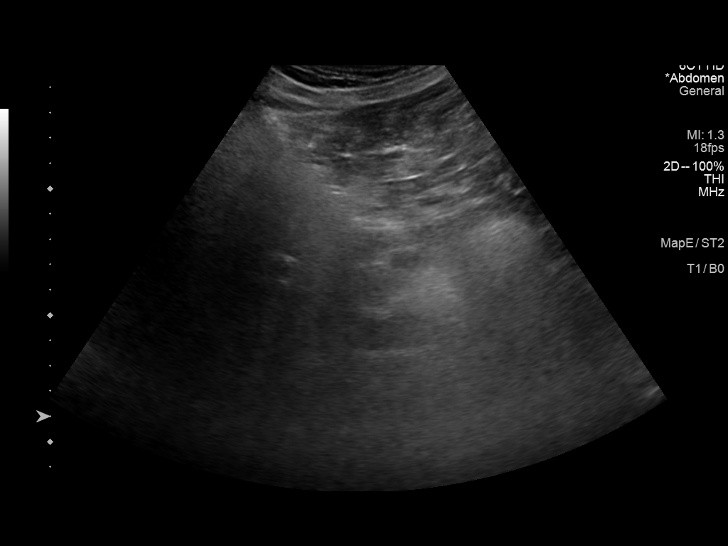

[14 of 22 positions shown; findings below may reference images not displayed]

FINDINGS: Abdominal aortic measurements as follows:

Proximal: 0.4 x 2.4 cm. Limited visualization due to overlying bowel
gas

Mid:  1.9 x 1.7  cm

Distal:  2.1 x 1.9 cm
IMPRESSION: No evidence of abdominal aortic aneurysm, accounting for limited
visualization of the proximal abdominal aorta.

## 2019-04-24 DIAGNOSIS — N1831 Chronic kidney disease, stage 3a: Secondary | ICD-10-CM | POA: Diagnosis not present

## 2019-04-24 DIAGNOSIS — R7303 Prediabetes: Secondary | ICD-10-CM | POA: Diagnosis not present

## 2019-04-24 DIAGNOSIS — M545 Low back pain: Secondary | ICD-10-CM | POA: Diagnosis not present

## 2019-04-24 DIAGNOSIS — Z0000 Encounter for general adult medical examination without abnormal findings: Secondary | ICD-10-CM | POA: Diagnosis not present

## 2019-04-24 DIAGNOSIS — E782 Mixed hyperlipidemia: Secondary | ICD-10-CM | POA: Diagnosis not present

## 2019-04-24 DIAGNOSIS — I10 Essential (primary) hypertension: Secondary | ICD-10-CM | POA: Diagnosis not present

## 2019-04-24 DIAGNOSIS — M797 Fibromyalgia: Secondary | ICD-10-CM | POA: Diagnosis not present

## 2019-04-24 DIAGNOSIS — Z1389 Encounter for screening for other disorder: Secondary | ICD-10-CM | POA: Diagnosis not present

## 2019-04-24 DIAGNOSIS — G4700 Insomnia, unspecified: Secondary | ICD-10-CM | POA: Diagnosis not present

## 2019-04-27 DIAGNOSIS — R946 Abnormal results of thyroid function studies: Secondary | ICD-10-CM | POA: Diagnosis not present

## 2019-04-27 DIAGNOSIS — R7303 Prediabetes: Secondary | ICD-10-CM | POA: Diagnosis not present

## 2019-04-27 DIAGNOSIS — Z125 Encounter for screening for malignant neoplasm of prostate: Secondary | ICD-10-CM | POA: Diagnosis not present

## 2019-04-27 DIAGNOSIS — E782 Mixed hyperlipidemia: Secondary | ICD-10-CM | POA: Diagnosis not present

## 2019-04-27 DIAGNOSIS — E559 Vitamin D deficiency, unspecified: Secondary | ICD-10-CM | POA: Diagnosis not present

## 2019-04-30 DIAGNOSIS — Z1211 Encounter for screening for malignant neoplasm of colon: Secondary | ICD-10-CM | POA: Diagnosis not present

## 2019-06-07 DIAGNOSIS — E039 Hypothyroidism, unspecified: Secondary | ICD-10-CM | POA: Diagnosis not present

## 2019-06-07 DIAGNOSIS — R7989 Other specified abnormal findings of blood chemistry: Secondary | ICD-10-CM | POA: Diagnosis not present

## 2019-06-07 DIAGNOSIS — R946 Abnormal results of thyroid function studies: Secondary | ICD-10-CM | POA: Diagnosis not present

## 2019-07-20 DIAGNOSIS — E039 Hypothyroidism, unspecified: Secondary | ICD-10-CM | POA: Diagnosis not present

## 2019-08-17 DIAGNOSIS — K589 Irritable bowel syndrome without diarrhea: Secondary | ICD-10-CM | POA: Diagnosis not present

## 2019-08-23 DIAGNOSIS — M6530 Trigger finger, unspecified finger: Secondary | ICD-10-CM | POA: Diagnosis not present

## 2019-08-23 DIAGNOSIS — I10 Essential (primary) hypertension: Secondary | ICD-10-CM | POA: Diagnosis not present

## 2019-09-18 DIAGNOSIS — M65322 Trigger finger, left index finger: Secondary | ICD-10-CM | POA: Diagnosis not present

## 2019-09-18 DIAGNOSIS — M65342 Trigger finger, left ring finger: Secondary | ICD-10-CM | POA: Diagnosis not present

## 2019-10-16 DIAGNOSIS — M65342 Trigger finger, left ring finger: Secondary | ICD-10-CM | POA: Diagnosis not present

## 2019-10-23 DIAGNOSIS — R031 Nonspecific low blood-pressure reading: Secondary | ICD-10-CM | POA: Diagnosis not present

## 2019-10-23 DIAGNOSIS — W57XXXA Bitten or stung by nonvenomous insect and other nonvenomous arthropods, initial encounter: Secondary | ICD-10-CM | POA: Diagnosis not present

## 2019-10-23 DIAGNOSIS — R200 Anesthesia of skin: Secondary | ICD-10-CM | POA: Diagnosis not present

## 2019-10-23 DIAGNOSIS — J069 Acute upper respiratory infection, unspecified: Secondary | ICD-10-CM | POA: Diagnosis not present

## 2019-10-23 DIAGNOSIS — S70362A Insect bite (nonvenomous), left thigh, initial encounter: Secondary | ICD-10-CM | POA: Diagnosis not present

## 2019-10-26 ENCOUNTER — Other Ambulatory Visit: Payer: Self-pay

## 2019-10-26 ENCOUNTER — Emergency Department (HOSPITAL_COMMUNITY)
Admission: EM | Admit: 2019-10-26 | Discharge: 2019-10-27 | Discharge status: Against Medical Advice | Payer: Medicare HMO | Attending: Emergency Medicine | Admitting: Emergency Medicine

## 2019-10-26 ENCOUNTER — Encounter (HOSPITAL_COMMUNITY): Payer: Self-pay | Admitting: *Deleted

## 2019-10-26 DIAGNOSIS — H539 Unspecified visual disturbance: Secondary | ICD-10-CM | POA: Diagnosis not present

## 2019-10-26 DIAGNOSIS — R040 Epistaxis: Secondary | ICD-10-CM | POA: Diagnosis not present

## 2019-10-26 DIAGNOSIS — Z5321 Procedure and treatment not carried out due to patient leaving prior to being seen by health care provider: Secondary | ICD-10-CM | POA: Diagnosis not present

## 2019-10-26 DIAGNOSIS — R197 Diarrhea, unspecified: Secondary | ICD-10-CM | POA: Diagnosis not present

## 2019-10-26 DIAGNOSIS — R200 Anesthesia of skin: Secondary | ICD-10-CM | POA: Diagnosis not present

## 2019-10-26 DIAGNOSIS — R21 Rash and other nonspecific skin eruption: Secondary | ICD-10-CM | POA: Diagnosis not present

## 2019-10-26 DIAGNOSIS — R531 Weakness: Secondary | ICD-10-CM | POA: Diagnosis not present

## 2019-10-26 DIAGNOSIS — R05 Cough: Secondary | ICD-10-CM | POA: Diagnosis not present

## 2019-10-26 LAB — COMPREHENSIVE METABOLIC PANEL
ALT: 42 U/L (ref 0–44)
AST: 35 U/L (ref 15–41)
Albumin: 2.8 g/dL — ABNORMAL LOW (ref 3.5–5.0)
Alkaline Phosphatase: 82 U/L (ref 38–126)
Anion gap: 11 (ref 5–15)
BUN: 43 mg/dL — ABNORMAL HIGH (ref 8–23)
CO2: 27 mmol/L (ref 22–32)
Calcium: 10.1 mg/dL (ref 8.9–10.3)
Chloride: 101 mmol/L (ref 98–111)
Creatinine, Ser: 2.24 mg/dL — ABNORMAL HIGH (ref 0.61–1.24)
GFR calc Af Amer: 34 mL/min — ABNORMAL LOW (ref >60)
GFR calc non Af Amer: 29 mL/min — ABNORMAL LOW (ref >60)
Glucose, Bld: 166 mg/dL — ABNORMAL HIGH (ref 70–99)
Potassium: 3 mmol/L — ABNORMAL LOW (ref 3.5–5.1)
Sodium: 139 mmol/L (ref 135–145)
Total Bilirubin: 0.6 mg/dL (ref 0.3–1.2)
Total Protein: 6.1 g/dL — ABNORMAL LOW (ref 6.5–8.1)

## 2019-10-26 LAB — CBC
HCT: 37.9 % — ABNORMAL LOW (ref 39.0–52.0)
Hemoglobin: 12.6 g/dL — ABNORMAL LOW (ref 13.0–17.0)
MCH: 29.9 pg (ref 26.0–34.0)
MCHC: 33.2 g/dL (ref 30.0–36.0)
MCV: 89.8 fL (ref 80.0–100.0)
Platelets: 296 10*3/uL (ref 150–400)
RBC: 4.22 MIL/uL (ref 4.22–5.81)
RDW: 12.8 % (ref 11.5–15.5)
WBC: 9.1 10*3/uL (ref 4.0–10.5)
nRBC: 0 % (ref 0.0–0.2)

## 2019-10-26 LAB — LIPASE, BLOOD: Lipase: 25 U/L (ref 11–51)

## 2019-10-26 MED ORDER — SODIUM CHLORIDE 0.9% FLUSH: 3.0000 mL | Freq: Once | INTRAVENOUS | Status: DC

## 2019-10-26 NOTE — ED Notes (Signed)
Called for patient x3 for vital sign reassessment with no response

## 2019-10-26 NOTE — ED Triage Notes (Signed)
Pt has onset last week of n/v/d and fever. Reports v/d has improved but has weakness, lack of appetite and dizziness. Has mild rash to arms, and left knee, had recent tick bite. Ongoing head congestion and nosebleeds. Also reports burning pain to bilateral hands and feet.

## 2019-10-31 DIAGNOSIS — R600 Localized edema: Secondary | ICD-10-CM | POA: Diagnosis not present

## 2019-10-31 DIAGNOSIS — R21 Rash and other nonspecific skin eruption: Secondary | ICD-10-CM | POA: Diagnosis not present

## 2019-10-31 DIAGNOSIS — W57XXXD Bitten or stung by nonvenomous insect and other nonvenomous arthropods, subsequent encounter: Secondary | ICD-10-CM | POA: Diagnosis not present

## 2019-10-31 DIAGNOSIS — N179 Acute kidney failure, unspecified: Secondary | ICD-10-CM | POA: Diagnosis not present

## 2019-11-16 DIAGNOSIS — L309 Dermatitis, unspecified: Secondary | ICD-10-CM | POA: Diagnosis not present

## 2019-11-16 DIAGNOSIS — I10 Essential (primary) hypertension: Secondary | ICD-10-CM | POA: Diagnosis not present

## 2019-11-16 DIAGNOSIS — R609 Edema, unspecified: Secondary | ICD-10-CM | POA: Diagnosis not present

## 2019-11-29 DIAGNOSIS — M79605 Pain in left leg: Secondary | ICD-10-CM | POA: Diagnosis not present

## 2019-11-29 DIAGNOSIS — M79604 Pain in right leg: Secondary | ICD-10-CM | POA: Diagnosis not present

## 2019-11-29 DIAGNOSIS — I83893 Varicose veins of bilateral lower extremities with other complications: Secondary | ICD-10-CM | POA: Diagnosis not present

## 2019-11-29 DIAGNOSIS — R600 Localized edema: Secondary | ICD-10-CM | POA: Diagnosis not present

## 2019-12-04 DIAGNOSIS — L309 Dermatitis, unspecified: Secondary | ICD-10-CM | POA: Diagnosis not present

## 2019-12-06 DIAGNOSIS — L309 Dermatitis, unspecified: Secondary | ICD-10-CM | POA: Diagnosis not present

## 2019-12-06 DIAGNOSIS — R609 Edema, unspecified: Secondary | ICD-10-CM | POA: Diagnosis not present

## 2019-12-06 DIAGNOSIS — I10 Essential (primary) hypertension: Secondary | ICD-10-CM | POA: Diagnosis not present

## 2020-01-10 DIAGNOSIS — I83813 Varicose veins of bilateral lower extremities with pain: Secondary | ICD-10-CM | POA: Diagnosis not present

## 2020-01-10 DIAGNOSIS — I87393 Chronic venous hypertension (idiopathic) with other complications of bilateral lower extremity: Secondary | ICD-10-CM | POA: Diagnosis not present

## 2020-05-01 DIAGNOSIS — Z125 Encounter for screening for malignant neoplasm of prostate: Secondary | ICD-10-CM | POA: Diagnosis not present

## 2020-05-01 DIAGNOSIS — E559 Vitamin D deficiency, unspecified: Secondary | ICD-10-CM | POA: Diagnosis not present

## 2020-05-01 DIAGNOSIS — Z1389 Encounter for screening for other disorder: Secondary | ICD-10-CM | POA: Diagnosis not present

## 2020-05-01 DIAGNOSIS — G4700 Insomnia, unspecified: Secondary | ICD-10-CM | POA: Diagnosis not present

## 2020-05-01 DIAGNOSIS — Z23 Encounter for immunization: Secondary | ICD-10-CM | POA: Diagnosis not present

## 2020-05-01 DIAGNOSIS — M797 Fibromyalgia: Secondary | ICD-10-CM | POA: Diagnosis not present

## 2020-05-01 DIAGNOSIS — E039 Hypothyroidism, unspecified: Secondary | ICD-10-CM | POA: Diagnosis not present

## 2020-05-01 DIAGNOSIS — E782 Mixed hyperlipidemia: Secondary | ICD-10-CM | POA: Diagnosis not present

## 2020-05-01 DIAGNOSIS — Z0000 Encounter for general adult medical examination without abnormal findings: Secondary | ICD-10-CM | POA: Diagnosis not present

## 2020-05-01 DIAGNOSIS — I10 Essential (primary) hypertension: Secondary | ICD-10-CM | POA: Diagnosis not present

## 2020-05-01 DIAGNOSIS — R7309 Other abnormal glucose: Secondary | ICD-10-CM | POA: Diagnosis not present

## 2020-05-01 DIAGNOSIS — D649 Anemia, unspecified: Secondary | ICD-10-CM | POA: Diagnosis not present

## 2020-06-13 DIAGNOSIS — H524 Presbyopia: Secondary | ICD-10-CM | POA: Diagnosis not present

## 2020-10-08 DIAGNOSIS — K219 Gastro-esophageal reflux disease without esophagitis: Secondary | ICD-10-CM | POA: Diagnosis not present

## 2020-10-08 DIAGNOSIS — I10 Essential (primary) hypertension: Secondary | ICD-10-CM | POA: Diagnosis not present

## 2020-10-08 DIAGNOSIS — E781 Pure hyperglyceridemia: Secondary | ICD-10-CM | POA: Diagnosis not present

## 2020-10-08 DIAGNOSIS — N1831 Chronic kidney disease, stage 3a: Secondary | ICD-10-CM | POA: Diagnosis not present

## 2020-10-08 DIAGNOSIS — E782 Mixed hyperlipidemia: Secondary | ICD-10-CM | POA: Diagnosis not present

## 2020-10-08 DIAGNOSIS — G4700 Insomnia, unspecified: Secondary | ICD-10-CM | POA: Diagnosis not present

## 2020-10-08 DIAGNOSIS — E039 Hypothyroidism, unspecified: Secondary | ICD-10-CM | POA: Diagnosis not present

## 2020-10-28 DIAGNOSIS — E039 Hypothyroidism, unspecified: Secondary | ICD-10-CM | POA: Diagnosis not present

## 2020-10-28 DIAGNOSIS — E782 Mixed hyperlipidemia: Secondary | ICD-10-CM | POA: Diagnosis not present

## 2020-10-28 DIAGNOSIS — K219 Gastro-esophageal reflux disease without esophagitis: Secondary | ICD-10-CM | POA: Diagnosis not present

## 2020-10-28 DIAGNOSIS — E781 Pure hyperglyceridemia: Secondary | ICD-10-CM | POA: Diagnosis not present

## 2020-10-28 DIAGNOSIS — G4700 Insomnia, unspecified: Secondary | ICD-10-CM | POA: Diagnosis not present

## 2020-10-28 DIAGNOSIS — N1831 Chronic kidney disease, stage 3a: Secondary | ICD-10-CM | POA: Diagnosis not present

## 2020-10-28 DIAGNOSIS — I10 Essential (primary) hypertension: Secondary | ICD-10-CM | POA: Diagnosis not present

## 2020-10-31 DIAGNOSIS — G4700 Insomnia, unspecified: Secondary | ICD-10-CM | POA: Diagnosis not present

## 2020-10-31 DIAGNOSIS — M797 Fibromyalgia: Secondary | ICD-10-CM | POA: Diagnosis not present

## 2020-10-31 DIAGNOSIS — I10 Essential (primary) hypertension: Secondary | ICD-10-CM | POA: Diagnosis not present

## 2020-10-31 DIAGNOSIS — K219 Gastro-esophageal reflux disease without esophagitis: Secondary | ICD-10-CM | POA: Diagnosis not present

## 2020-10-31 DIAGNOSIS — R7303 Prediabetes: Secondary | ICD-10-CM | POA: Diagnosis not present

## 2020-10-31 DIAGNOSIS — E559 Vitamin D deficiency, unspecified: Secondary | ICD-10-CM | POA: Diagnosis not present

## 2020-10-31 DIAGNOSIS — M545 Low back pain, unspecified: Secondary | ICD-10-CM | POA: Diagnosis not present

## 2020-10-31 DIAGNOSIS — N1831 Chronic kidney disease, stage 3a: Secondary | ICD-10-CM | POA: Diagnosis not present

## 2020-10-31 DIAGNOSIS — E782 Mixed hyperlipidemia: Secondary | ICD-10-CM | POA: Diagnosis not present

## 2020-10-31 DIAGNOSIS — E039 Hypothyroidism, unspecified: Secondary | ICD-10-CM | POA: Diagnosis not present

## 2021-01-09 DIAGNOSIS — K219 Gastro-esophageal reflux disease without esophagitis: Secondary | ICD-10-CM | POA: Diagnosis not present

## 2021-01-09 DIAGNOSIS — I10 Essential (primary) hypertension: Secondary | ICD-10-CM | POA: Diagnosis not present

## 2021-01-09 DIAGNOSIS — H6120 Impacted cerumen, unspecified ear: Secondary | ICD-10-CM | POA: Diagnosis not present

## 2021-01-09 DIAGNOSIS — E781 Pure hyperglyceridemia: Secondary | ICD-10-CM | POA: Diagnosis not present

## 2021-01-09 DIAGNOSIS — G4700 Insomnia, unspecified: Secondary | ICD-10-CM | POA: Diagnosis not present

## 2021-01-09 DIAGNOSIS — E039 Hypothyroidism, unspecified: Secondary | ICD-10-CM | POA: Diagnosis not present

## 2021-01-09 DIAGNOSIS — N1831 Chronic kidney disease, stage 3a: Secondary | ICD-10-CM | POA: Diagnosis not present

## 2021-01-09 DIAGNOSIS — E782 Mixed hyperlipidemia: Secondary | ICD-10-CM | POA: Diagnosis not present

## 2021-02-10 DIAGNOSIS — E782 Mixed hyperlipidemia: Secondary | ICD-10-CM | POA: Diagnosis not present

## 2021-02-10 DIAGNOSIS — K219 Gastro-esophageal reflux disease without esophagitis: Secondary | ICD-10-CM | POA: Diagnosis not present

## 2021-02-10 DIAGNOSIS — N1831 Chronic kidney disease, stage 3a: Secondary | ICD-10-CM | POA: Diagnosis not present

## 2021-02-10 DIAGNOSIS — G4700 Insomnia, unspecified: Secondary | ICD-10-CM | POA: Diagnosis not present

## 2021-02-10 DIAGNOSIS — E039 Hypothyroidism, unspecified: Secondary | ICD-10-CM | POA: Diagnosis not present

## 2021-02-10 DIAGNOSIS — E781 Pure hyperglyceridemia: Secondary | ICD-10-CM | POA: Diagnosis not present

## 2021-02-10 DIAGNOSIS — I10 Essential (primary) hypertension: Secondary | ICD-10-CM | POA: Diagnosis not present

## 2021-04-08 DIAGNOSIS — E781 Pure hyperglyceridemia: Secondary | ICD-10-CM | POA: Diagnosis not present

## 2021-04-08 DIAGNOSIS — E782 Mixed hyperlipidemia: Secondary | ICD-10-CM | POA: Diagnosis not present

## 2021-04-08 DIAGNOSIS — I10 Essential (primary) hypertension: Secondary | ICD-10-CM | POA: Diagnosis not present

## 2021-04-08 DIAGNOSIS — E039 Hypothyroidism, unspecified: Secondary | ICD-10-CM | POA: Diagnosis not present

## 2021-04-08 DIAGNOSIS — N1831 Chronic kidney disease, stage 3a: Secondary | ICD-10-CM | POA: Diagnosis not present

## 2021-04-08 DIAGNOSIS — G4700 Insomnia, unspecified: Secondary | ICD-10-CM | POA: Diagnosis not present

## 2021-04-08 DIAGNOSIS — K219 Gastro-esophageal reflux disease without esophagitis: Secondary | ICD-10-CM | POA: Diagnosis not present

## 2021-05-19 DIAGNOSIS — M545 Low back pain, unspecified: Secondary | ICD-10-CM | POA: Diagnosis not present

## 2021-05-19 DIAGNOSIS — M542 Cervicalgia: Secondary | ICD-10-CM | POA: Diagnosis not present

## 2021-05-19 DIAGNOSIS — R4702 Dysphasia: Secondary | ICD-10-CM | POA: Diagnosis not present

## 2021-05-19 DIAGNOSIS — M5412 Radiculopathy, cervical region: Secondary | ICD-10-CM | POA: Diagnosis not present

## 2021-06-05 DIAGNOSIS — R7303 Prediabetes: Secondary | ICD-10-CM | POA: Diagnosis not present

## 2021-06-05 DIAGNOSIS — I10 Essential (primary) hypertension: Secondary | ICD-10-CM | POA: Diagnosis not present

## 2021-06-05 DIAGNOSIS — E782 Mixed hyperlipidemia: Secondary | ICD-10-CM | POA: Diagnosis not present

## 2021-06-05 DIAGNOSIS — Z1389 Encounter for screening for other disorder: Secondary | ICD-10-CM | POA: Diagnosis not present

## 2021-06-05 DIAGNOSIS — M5412 Radiculopathy, cervical region: Secondary | ICD-10-CM | POA: Diagnosis not present

## 2021-06-05 DIAGNOSIS — N1831 Chronic kidney disease, stage 3a: Secondary | ICD-10-CM | POA: Diagnosis not present

## 2021-06-05 DIAGNOSIS — U071 COVID-19: Secondary | ICD-10-CM | POA: Diagnosis not present

## 2021-06-05 DIAGNOSIS — E039 Hypothyroidism, unspecified: Secondary | ICD-10-CM | POA: Diagnosis not present

## 2021-06-05 DIAGNOSIS — Z0000 Encounter for general adult medical examination without abnormal findings: Secondary | ICD-10-CM | POA: Diagnosis not present

## 2021-06-14 DIAGNOSIS — M542 Cervicalgia: Secondary | ICD-10-CM | POA: Diagnosis not present

## 2021-06-16 DIAGNOSIS — E559 Vitamin D deficiency, unspecified: Secondary | ICD-10-CM | POA: Diagnosis not present

## 2021-06-16 DIAGNOSIS — Z23 Encounter for immunization: Secondary | ICD-10-CM | POA: Diagnosis not present

## 2021-06-16 DIAGNOSIS — E782 Mixed hyperlipidemia: Secondary | ICD-10-CM | POA: Diagnosis not present

## 2021-06-16 DIAGNOSIS — Z125 Encounter for screening for malignant neoplasm of prostate: Secondary | ICD-10-CM | POA: Diagnosis not present

## 2021-06-16 DIAGNOSIS — E039 Hypothyroidism, unspecified: Secondary | ICD-10-CM | POA: Diagnosis not present

## 2021-06-16 DIAGNOSIS — R7303 Prediabetes: Secondary | ICD-10-CM | POA: Diagnosis not present

## 2021-06-19 DIAGNOSIS — M542 Cervicalgia: Secondary | ICD-10-CM | POA: Diagnosis not present

## 2021-06-19 DIAGNOSIS — M5451 Vertebrogenic low back pain: Secondary | ICD-10-CM | POA: Diagnosis not present

## 2021-06-22 DIAGNOSIS — M5416 Radiculopathy, lumbar region: Secondary | ICD-10-CM | POA: Diagnosis not present

## 2021-07-01 DIAGNOSIS — G8929 Other chronic pain: Secondary | ICD-10-CM | POA: Diagnosis not present

## 2021-07-01 DIAGNOSIS — M5412 Radiculopathy, cervical region: Secondary | ICD-10-CM | POA: Diagnosis not present

## 2021-07-08 DIAGNOSIS — E039 Hypothyroidism, unspecified: Secondary | ICD-10-CM | POA: Diagnosis not present

## 2021-07-08 DIAGNOSIS — E1169 Type 2 diabetes mellitus with other specified complication: Secondary | ICD-10-CM | POA: Diagnosis not present

## 2021-07-08 DIAGNOSIS — E782 Mixed hyperlipidemia: Secondary | ICD-10-CM | POA: Diagnosis not present

## 2021-07-08 DIAGNOSIS — N1831 Chronic kidney disease, stage 3a: Secondary | ICD-10-CM | POA: Diagnosis not present

## 2021-07-08 DIAGNOSIS — I10 Essential (primary) hypertension: Secondary | ICD-10-CM | POA: Diagnosis not present

## 2021-07-15 DIAGNOSIS — M5412 Radiculopathy, cervical region: Secondary | ICD-10-CM | POA: Diagnosis not present

## 2021-07-15 DIAGNOSIS — G8929 Other chronic pain: Secondary | ICD-10-CM | POA: Diagnosis not present

## 2021-07-22 DIAGNOSIS — G8929 Other chronic pain: Secondary | ICD-10-CM | POA: Diagnosis not present

## 2021-07-22 DIAGNOSIS — M5412 Radiculopathy, cervical region: Secondary | ICD-10-CM | POA: Diagnosis not present

## 2021-07-29 DIAGNOSIS — G8929 Other chronic pain: Secondary | ICD-10-CM | POA: Diagnosis not present

## 2021-07-29 DIAGNOSIS — M5412 Radiculopathy, cervical region: Secondary | ICD-10-CM | POA: Diagnosis not present

## 2021-08-05 DIAGNOSIS — G8929 Other chronic pain: Secondary | ICD-10-CM | POA: Diagnosis not present

## 2021-08-05 DIAGNOSIS — M5412 Radiculopathy, cervical region: Secondary | ICD-10-CM | POA: Diagnosis not present

## 2021-08-12 DIAGNOSIS — M5412 Radiculopathy, cervical region: Secondary | ICD-10-CM | POA: Diagnosis not present

## 2021-08-12 DIAGNOSIS — G8929 Other chronic pain: Secondary | ICD-10-CM | POA: Diagnosis not present

## 2021-08-13 DIAGNOSIS — M5412 Radiculopathy, cervical region: Secondary | ICD-10-CM | POA: Diagnosis not present

## 2021-08-13 DIAGNOSIS — G8929 Other chronic pain: Secondary | ICD-10-CM | POA: Diagnosis not present

## 2021-08-17 DIAGNOSIS — M5412 Radiculopathy, cervical region: Secondary | ICD-10-CM | POA: Diagnosis not present

## 2021-08-17 DIAGNOSIS — G8929 Other chronic pain: Secondary | ICD-10-CM | POA: Diagnosis not present

## 2021-08-24 DIAGNOSIS — G8929 Other chronic pain: Secondary | ICD-10-CM | POA: Diagnosis not present

## 2021-08-24 DIAGNOSIS — M5412 Radiculopathy, cervical region: Secondary | ICD-10-CM | POA: Diagnosis not present

## 2021-09-04 DIAGNOSIS — M5412 Radiculopathy, cervical region: Secondary | ICD-10-CM | POA: Diagnosis not present

## 2021-09-04 DIAGNOSIS — G8929 Other chronic pain: Secondary | ICD-10-CM | POA: Diagnosis not present

## 2021-09-07 DIAGNOSIS — M5412 Radiculopathy, cervical region: Secondary | ICD-10-CM | POA: Diagnosis not present

## 2021-09-07 DIAGNOSIS — G8929 Other chronic pain: Secondary | ICD-10-CM | POA: Diagnosis not present

## 2021-09-18 DIAGNOSIS — M5459 Other low back pain: Secondary | ICD-10-CM | POA: Diagnosis not present

## 2021-09-24 DIAGNOSIS — M797 Fibromyalgia: Secondary | ICD-10-CM | POA: Diagnosis not present

## 2021-09-24 DIAGNOSIS — E782 Mixed hyperlipidemia: Secondary | ICD-10-CM | POA: Diagnosis not present

## 2021-09-24 DIAGNOSIS — E039 Hypothyroidism, unspecified: Secondary | ICD-10-CM | POA: Diagnosis not present

## 2021-09-24 DIAGNOSIS — K219 Gastro-esophageal reflux disease without esophagitis: Secondary | ICD-10-CM | POA: Diagnosis not present

## 2021-09-24 DIAGNOSIS — E559 Vitamin D deficiency, unspecified: Secondary | ICD-10-CM | POA: Diagnosis not present

## 2021-09-24 DIAGNOSIS — E1169 Type 2 diabetes mellitus with other specified complication: Secondary | ICD-10-CM | POA: Diagnosis not present

## 2021-09-24 DIAGNOSIS — N1831 Chronic kidney disease, stage 3a: Secondary | ICD-10-CM | POA: Diagnosis not present

## 2021-09-24 DIAGNOSIS — M5412 Radiculopathy, cervical region: Secondary | ICD-10-CM | POA: Diagnosis not present

## 2021-09-24 DIAGNOSIS — I10 Essential (primary) hypertension: Secondary | ICD-10-CM | POA: Diagnosis not present

## 2021-09-24 DIAGNOSIS — G4700 Insomnia, unspecified: Secondary | ICD-10-CM | POA: Diagnosis not present

## 2021-09-29 ENCOUNTER — Other Ambulatory Visit: Payer: Self-pay | Admitting: Physician Assistant

## 2021-10-05 DIAGNOSIS — E039 Hypothyroidism, unspecified: Secondary | ICD-10-CM | POA: Diagnosis not present

## 2021-10-05 DIAGNOSIS — E1169 Type 2 diabetes mellitus with other specified complication: Secondary | ICD-10-CM | POA: Diagnosis not present

## 2021-10-05 DIAGNOSIS — N1831 Chronic kidney disease, stage 3a: Secondary | ICD-10-CM | POA: Diagnosis not present

## 2021-10-05 DIAGNOSIS — E782 Mixed hyperlipidemia: Secondary | ICD-10-CM | POA: Diagnosis not present

## 2021-10-05 DIAGNOSIS — I10 Essential (primary) hypertension: Secondary | ICD-10-CM | POA: Diagnosis not present

## 2021-10-09 ENCOUNTER — Other Ambulatory Visit: Payer: Self-pay | Admitting: Physician Assistant

## 2021-12-30 ENCOUNTER — Other Ambulatory Visit: Payer: Self-pay | Admitting: Physician Assistant

## 2022-04-19 DIAGNOSIS — Z23 Encounter for immunization: Secondary | ICD-10-CM | POA: Diagnosis not present

## 2022-04-19 DIAGNOSIS — G4700 Insomnia, unspecified: Secondary | ICD-10-CM | POA: Diagnosis not present

## 2022-04-19 DIAGNOSIS — E039 Hypothyroidism, unspecified: Secondary | ICD-10-CM | POA: Diagnosis not present

## 2022-04-19 DIAGNOSIS — E1169 Type 2 diabetes mellitus with other specified complication: Secondary | ICD-10-CM | POA: Diagnosis not present

## 2022-04-19 DIAGNOSIS — M5412 Radiculopathy, cervical region: Secondary | ICD-10-CM | POA: Diagnosis not present

## 2022-04-19 DIAGNOSIS — N1831 Chronic kidney disease, stage 3a: Secondary | ICD-10-CM | POA: Diagnosis not present

## 2022-04-19 DIAGNOSIS — M797 Fibromyalgia: Secondary | ICD-10-CM | POA: Diagnosis not present

## 2022-04-19 DIAGNOSIS — I10 Essential (primary) hypertension: Secondary | ICD-10-CM | POA: Diagnosis not present

## 2022-04-19 DIAGNOSIS — E782 Mixed hyperlipidemia: Secondary | ICD-10-CM | POA: Diagnosis not present

## 2022-05-14 DIAGNOSIS — M542 Cervicalgia: Secondary | ICD-10-CM | POA: Diagnosis not present

## 2022-06-01 DIAGNOSIS — M5412 Radiculopathy, cervical region: Secondary | ICD-10-CM | POA: Diagnosis not present

## 2022-06-18 DIAGNOSIS — M4810 Ankylosing hyperostosis [Forestier], site unspecified: Secondary | ICD-10-CM | POA: Diagnosis not present

## 2022-06-18 DIAGNOSIS — M542 Cervicalgia: Secondary | ICD-10-CM | POA: Diagnosis not present

## 2022-07-05 NOTE — Pre-Procedure Instructions (Signed)
Surgical Instructions    Your procedure is scheduled on July 15, 2022.  Report to [facility] Main Entrance "A" at 8:30 A.M., then check in with the Admitting office.  Call this number if you have problems the morning of surgery:  [phone]  If you have any questions prior to your surgery date call [phone]: Open Monday-Friday 8am-4pm If you experience any cold or flu symptoms such as cough, fever, chills, shortness of breath, etc. between now and your scheduled surgery, please notify us at the above number.     Remember:  Do not eat after midnight the night before your surgery  You may drink clear liquids until 7:30 AM the morning of your surgery.   Clear liquids allowed are: Water, Non-Citrus Juices (without pulp), Carbonated Beverages, Clear Tea, Black Coffee Only (NO MILK, CREAM OR POWDERED CREAMER of any kind), and Gatorade.     Take these medicines the morning of surgery with A SIP OF WATER:  acetaminophen (TYLENOL)   amLODipine (NORVASC)   CYMBALTA   fenofibrate   fluticasone (FLONASE)   gabapentin (NEURONTIN)   levothyroxine (SYNTHROID)   metoprolol tartrate (LOPRESSOR)   omeprazole (PRILOSEC)   tiZANidine (ZANAFLEX)     As of today, STOP taking any Aspirin (unless otherwise instructed by your surgeon) Aleve, Naproxen, Ibuprofen, Motrin, Advil, Goody's, BC's, all herbal medications, fish oil, and all vitamins.                     Do NOT Smoke (Tobacco/Vaping) for 24 hours prior to your procedure.  If you use a CPAP at night, you may bring your mask/headgear for your overnight stay.   Contacts, glasses, piercing's, hearing aid's, dentures or partials may not be worn into surgery, please bring cases for these belongings.    For patients admitted to the hospital, discharge time will be determined by your treatment team.   Patients discharged the day of surgery will not be allowed to drive home, and someone needs to stay with them for 24 hours.  SURGICAL  WAITING ROOM VISITATION Patients having surgery or a procedure may have no more than 2 support people in the waiting area - these visitors may rotate.   Children under the age of 92 must have an adult with them who is not the patient. If the patient needs to stay at the hospital during part of their recovery, the visitor guidelines for inpatient rooms apply. Pre-op nurse will coordinate an appropriate time for 1 support person to accompany patient in pre-op.  This support person may not rotate.   Please refer to the [facility] website for the visitor guidelines for Inpatients (after your surgery is over and you are in a regular room).    Special instructions:   [facility]- Preparing For Surgery  Before surgery, you can play an important role. Because skin is not sterile, your skin needs to be as free of germs as possible. You can reduce the number of germs on your skin by washing with CHG (chlorahexidine gluconate) Soap before surgery.  CHG is an antiseptic cleaner which kills germs and bonds with the skin to continue killing germs even after washing.    Oral Hygiene is also important to reduce your risk of infection.  Remember - BRUSH YOUR TEETH THE MORNING OF SURGERY WITH YOUR REGULAR TOOTHPASTE  Please do not use if you have an allergy to CHG or antibacterial soaps. If your skin becomes reddened/irritated stop using the CHG.  Do not shave (  including legs and underarms) for at least 48 hours prior to first CHG shower. It is OK to shave your face.  Please follow these instructions carefully.   Shower the NIGHT BEFORE SURGERY and the MORNING OF SURGERY  If you chose to wash your hair, wash your hair first as usual with your normal shampoo.  After you shampoo, rinse your hair and body thoroughly to remove the shampoo.  Use CHG Soap as you would any other liquid soap. You can apply CHG directly to the skin and wash gently with a scrungie or a clean washcloth.   Apply the CHG Soap to your  body ONLY FROM THE NECK DOWN.  Do not use on open wounds or open sores. Avoid contact with your eyes, ears, mouth and genitals (private parts). Wash Face and genitals (private parts)  with your normal soap.   Wash thoroughly, paying special attention to the area where your surgery will be performed.  Thoroughly rinse your body with warm water from the neck down.  DO NOT shower/wash with your normal soap after using and rinsing off the CHG Soap.  Pat yourself dry with a CLEAN TOWEL.  Wear CLEAN PAJAMAS to bed the night before surgery  Place CLEAN SHEETS on your bed the night before your surgery  DO NOT SLEEP WITH PETS.   Day of Surgery: Take a shower with CHG soap. Do not wear jewelry or makeup Do not wear lotions, powders, perfumes/colognes, or deodorant. Do not shave 48 hours prior to surgery.  Men may shave face and neck. Do not bring valuables to the hospital.  [facility] is not responsible for any belongings or valuables. Do not wear nail polish, gel polish, artificial nails, or any other type of covering on natural nails (fingers and toes) If you have artificial nails or gel coating that need to be removed by a nail salon, please have this removed prior to surgery. Artificial nails or gel coating may interfere with anesthesia's ability to adequately monitor your vital signs.  Wear Clean/Comfortable clothing the morning of surgery Remember to brush your teeth WITH YOUR REGULAR TOOTHPASTE.   Please read over the following fact sheets that you were given.    If you received a COVID test during your pre-op visit  it is requested that you wear a mask when out in public, stay away from anyone that may not be feeling well and notify your surgeon if you develop symptoms. If you have been in contact with anyone that has tested positive in the last 10 days please notify you surgeon.

## 2022-07-06 ENCOUNTER — Encounter (HOSPITAL_COMMUNITY): Payer: Self-pay

## 2022-07-06 ENCOUNTER — Other Ambulatory Visit: Payer: Self-pay

## 2022-07-06 ENCOUNTER — Encounter (HOSPITAL_COMMUNITY)
Admission: RE | Admit: 2022-07-06 | Discharge: 2022-07-06 | Disposition: A | Discharge status: Home / Self Care | Payer: Medicare HMO | Source: Ambulatory Visit | Attending: Orthopedic Surgery | Admitting: Orthopedic Surgery

## 2022-07-06 VITALS — BP 139/73 | HR 74 | Temp 97.9°F | Resp 17 | Ht 69.0 in | Wt 230.0 lb

## 2022-07-06 DIAGNOSIS — I2510 Atherosclerotic heart disease of native coronary artery without angina pectoris: Secondary | ICD-10-CM | POA: Insufficient documentation

## 2022-07-06 DIAGNOSIS — Z01818 Encounter for other preprocedural examination: Secondary | ICD-10-CM | POA: Insufficient documentation

## 2022-07-06 HISTORY — DX: Gastro-esophageal reflux disease without esophagitis: K21.9

## 2022-07-06 HISTORY — DX: Hypothyroidism, unspecified: E03.9

## 2022-07-06 HISTORY — DX: Unspecified osteoarthritis, unspecified site: M19.90

## 2022-07-06 LAB — CBC
HCT: 40 % (ref 39.0–52.0)
Hemoglobin: 13.4 g/dL (ref 13.0–17.0)
MCH: 30.3 pg (ref 26.0–34.0)
MCHC: 33.5 g/dL (ref 30.0–36.0)
MCV: 90.5 fL (ref 80.0–100.0)
Platelets: 248 10*3/uL (ref 150–400)
RBC: 4.42 MIL/uL (ref 4.22–5.81)
RDW: 11.8 % (ref 11.5–15.5)
WBC: 4.6 10*3/uL (ref 4.0–10.5)
nRBC: 0 % (ref 0.0–0.2)

## 2022-07-06 LAB — BASIC METABOLIC PANEL
Anion gap: 9 (ref 5–15)
BUN: 21 mg/dL (ref 8–23)
CO2: 30 mmol/L (ref 22–32)
Calcium: 9.4 mg/dL (ref 8.9–10.3)
Chloride: 98 mmol/L (ref 98–111)
Creatinine, Ser: 1.27 mg/dL — ABNORMAL HIGH (ref 0.61–1.24)
GFR, Estimated: >60 mL/min (ref >60)
Glucose, Bld: 102 mg/dL — ABNORMAL HIGH (ref 70–99)
Potassium: 4.2 mmol/L (ref 3.5–5.1)
Sodium: 137 mmol/L (ref 135–145)

## 2022-07-06 LAB — SURGICAL PCR SCREEN
MRSA, PCR: NEGATIVE
Staphylococcus aureus: NEGATIVE

## 2022-07-06 NOTE — Progress Notes (Signed)
PCP - Dr. Antony Contras Cardiologist - Pt saw Dr. Doreatha Lew many years ago, but MD has since retired and pt has not required additional MD visits  PPM/ICD - Denies Device Orders - n/a Rep Notified - n/a  Chest x-ray - n/a EKG - 07/06/2022 Stress Echo - 01/25/2014 Cardiac Cath - Per pt, had one around 2010 that resulted normal. Chest discomfort was from gallbladder disease which was later removed.  Sleep Study - Yes. CPAP - Wears nightly. Pt is unaware of pressure settings  No DM  Last dose of GLP1 agonist- n/a GLP1 instructions: n/a  Blood Thinner Instructions: n/a Aspirin Instructions: n/a  ERAS Protcol - Clear liquids until 0730 morning of surgery PRE-SURGERY Ensure or G2- n/a  COVID TEST- n/a   Anesthesia review: Yes. Per pt, PCP was sending surgical clearance to surgeon. Discussed with Karoline Caldwell, PA-C   Patient denies shortness of breath, fever, cough and chest pain at PAT appointment   All instructions explained to the patient, with a verbal understanding of the material. Patient agrees to go over the instructions while at home for a better understanding. Patient also instructed to self quarantine after being tested for COVID-19. The opportunity to ask questions was provided.

## 2022-07-07 NOTE — Anesthesia Preprocedure Evaluation (Addendum)
Anesthesia Evaluation  Patient identified by MRN, date of birth, ID band Patient awake    Reviewed: Allergy & Precautions, NPO status , Patient's Chart, lab work & pertinent test results, reviewed documented beta blocker date and time   Airway Mallampati: I  TM Distance: >3 FB Neck ROM: Full    Dental no notable dental hx. (+) Teeth Intact, Caps, Dental Advisory Given   Pulmonary sleep apnea and Continuous Positive Airway Pressure Ventilation , former smoker   Pulmonary exam normal breath sounds clear to auscultation       Cardiovascular hypertension, Pt. on medications Normal cardiovascular exam Rhythm:Regular Rate:Normal  EKG 07/06/22 NSR, Normal   Neuro/Psych  Headaches  Neuromuscular disease  negative psych ROS   GI/Hepatic Neg liver ROS,GERD  Medicated,,  Endo/Other  Hypothyroidism  Obesity Hyperlipidemia  Renal/GU Renal InsufficiencyRenal disease  negative genitourinary   Musculoskeletal  (+) Arthritis , Osteoarthritis,  Fibromyalgia -  Abdominal  (+) + obese  Peds  Hematology   Anesthesia Other Findings   Reproductive/Obstetrics                             Anesthesia Physical Anesthesia Plan  ASA: 2  Anesthesia Plan: General   Post-op Pain Management: Precedex, Dilaudid IV and Ofirmev IV (intra-op)*   Induction: Intravenous  PONV Risk Score and Plan: 3 and Treatment may vary due to age or medical condition, Ondansetron and Dexamethasone  Airway Management Planned: Oral ETT  Additional Equipment: None  Intra-op Plan:   Post-operative Plan: Extubation in OR  Informed Consent: I have reviewed the patients History and Physical, chart, labs and discussed the procedure including the risks, benefits and alternatives for the proposed anesthesia with the patient or authorized representative who has indicated his/her understanding and acceptance.     Dental advisory  given  Plan Discussed with: CRNA and Anesthesiologist  Anesthesia Plan Comments: (PAT note written 07/07/2022 by Myra Gianotti, PA-C.  )       Anesthesia Quick Evaluation

## 2022-07-07 NOTE — Progress Notes (Signed)
Anesthesia Chart Review:  Case: [id number] Date/Time: 07/15/22 1015   Procedure: ANTERIOR CERVICAL DECOMPRESSION/DISCECTOMY FUSION 1 LEVELC3-4, REMOVAL OF EXOSTOSIS C4-7   Anesthesia type: General   Pre-op diagnosis: Cervical spondylotic radiculopathy C3-4, DISH with auto fusion C4-7   Location: [facility] OR ROOM 04 / [place] OR   Surgeons: Melina Schools, MD       DISCUSSION: Patient is a 72 year old male scheduled for the above procedure.  History includes former smoker, HTN, HLD, OSA (uses CPAP), fibromyalgia, GERD, IBS, hypothyroidism. Labs suggested he had AKI with Creatinine 2.24 on 10/26/19 in the setting of N/V/D). BMI is consistent with obesity.  PCP Dr. Moreen Fowler signed a form of surgery clearance. Creatinine 1.27, previously 1.25 09/24/21 and 1.22 on 04/19/22 [place]).  Preoperative EKG shows normal sinus rhythm.  Anesthesia team to evaluate on the day of surgery.   VS: BP 139/73   Pulse 74   Temp 36.6 C   Resp 17   Ht 5' 9"$  (1.753 m)   Wt 104.3 kg   SpO2 100%   BMI 33.97 kg/m    PROVIDERS: Antony Contras, MD is PCP  Lyman Bishop, MD is cardiologist. As needed follow-up recommended at 04/04/14 visit. He had been seen palpitations and atypical chest pain with reassuring CPX and event monitor. Palpitations improved on b-blocker.    LABS: Labs reviewed: Acceptable for surgery. A1c 5.9% on 04/19/22 ([place]) (all labs ordered are listed, but only abnormal results are displayed)  Labs Reviewed  BASIC METABOLIC PANEL - Abnormal; Notable for the following components:      Result Value   Glucose, Bld 102 (*)    Creatinine, Ser 1.27 (*)    All other components within normal limits  SURGICAL PCR SCREEN  CBC    EKG: 07/06/21: NSR   CV: AAA [place] Screening 03/31/18: IMPRESSION: No evidence of abdominal aortic aneurysm, accounting for limited visualization of the proximal abdominal aorta.  CPX 01/25/14: Conclusion: Exercise testing with gas exchange demonstrates a low   normal functional capacity when compared to matched sedentary  norms. There does appear to be a hypoventilatory response to the  exercise which is likely due to the patient's obesity. There is  no obvious  circulatory limitation to the exercise. There was a hypertensive  response to the exercise.   Holter monitor 01/21/14-01/27/14: SR with isolated PVCs.  Reported a normal heart cath around 2010 when evaluated for chest discomfort and was subsequently diagnosed with gallbladder disease, s/p cholecystectomy.   Past Medical History:  Diagnosis Date   Arthritis    Carpal tunnel syndrome of right wrist 12/22/2012   Fibromyalgia    GERD (gastroesophageal reflux disease)    Headache(784.0)    sinus headaches; also dull headache almost every day, states related to fibromyalgia   Hyperlipidemia    Hypertension    under control with meds., has been on med. since 2008   Hypothyroidism    Irritable bowel syndrome    no current med.   Otitis media 12/25/2012   started antibiotic 12/25/2012 x 14 days   Seasonal allergies    Sleep apnea    uses CPAP nightly    Past Surgical History:  Procedure Laterality Date   CARDIAC CATHETERIZATION  2009   CARPAL TUNNEL RELEASE Left 11/28/2012   Procedure: CARPAL TUNNEL RELEASE;  Surgeon: Cammie Sickle., MD;  Location: [place];  Service: Orthopedics;  Laterality: Left;   CARPAL TUNNEL RELEASE Right 01/02/2013   Procedure: CARPAL TUNNEL RELEASE;  Surgeon:  Cammie Sickle., MD;  Location: [place];  Service: Orthopedics;  Laterality: Right;   CHOLECYSTECTOMY     COLONOSCOPY  2014   KNEE ARTHROSCOPY Bilateral    NASAL SEPTUM SURGERY     VASECTOMY      MEDICATIONS:  acetaminophen (TYLENOL) 500 MG tablet   amLODipine (NORVASC) 2.5 MG tablet   ascorbic acid (VITAMIN C) 500 MG tablet   Cholecalciferol (VITAMIN D PO)   CYMBALTA 60 MG capsule   diphenhydrAMINE (BENADRYL) 25 MG tablet   fenofibrate 160 MG  tablet   fenofibrate 160 MG tablet   fluticasone (FLONASE) 50 MCG/ACT nasal spray   gabapentin (NEURONTIN) 600 MG tablet   glycopyrrolate (ROBINUL) 2 MG tablet   hydrochlorothiazide (HYDRODIURIL) 25 MG tablet   ibuprofen (ADVIL,MOTRIN) 200 MG tablet   levothyroxine (SYNTHROID) 50 MCG tablet   metoprolol tartrate (LOPRESSOR) 25 MG tablet   Misc Natural Products (OSTEO BI-FLEX TRIPLE STRENGTH PO)   Multiple Vitamin (MULTIVITAMIN) tablet   NON FORMULARY   omeprazole (PRILOSEC) 40 MG capsule   rosuvastatin (CRESTOR) 10 MG tablet   tiZANidine (ZANAFLEX) 2 MG tablet   zolpidem (AMBIEN) 10 MG tablet   No current facility-administered medications for this encounter.    Myra Gianotti, PA-C Surgical Short Stay/Anesthesiology [facility] Phone [phone] [facility] Phone [phone] 07/07/2022 4:59 PM

## 2022-07-14 DIAGNOSIS — M542 Cervicalgia: Secondary | ICD-10-CM | POA: Diagnosis not present

## 2022-07-15 ENCOUNTER — Encounter (HOSPITAL_COMMUNITY): Payer: Self-pay | Admitting: Orthopedic Surgery

## 2022-07-15 ENCOUNTER — Ambulatory Visit (HOSPITAL_COMMUNITY): Payer: Self-pay | Admitting: Orthopedic Surgery

## 2022-07-15 ENCOUNTER — Inpatient Hospital Stay (HOSPITAL_COMMUNITY)
Admission: RE | Disposition: A | Discharge status: Home / Self Care | Payer: Self-pay | Source: Home / Self Care | Attending: Orthopedic Surgery

## 2022-07-15 ENCOUNTER — Other Ambulatory Visit: Payer: Self-pay

## 2022-07-15 ENCOUNTER — Inpatient Hospital Stay (HOSPITAL_COMMUNITY): Payer: Medicare HMO | Admitting: Anesthesiology

## 2022-07-15 ENCOUNTER — Inpatient Hospital Stay (HOSPITAL_COMMUNITY): Payer: Medicare HMO

## 2022-07-15 ENCOUNTER — Inpatient Hospital Stay (HOSPITAL_COMMUNITY): Payer: Medicare HMO | Admitting: Vascular Surgery

## 2022-07-15 ENCOUNTER — Inpatient Hospital Stay (HOSPITAL_COMMUNITY)
Admission: RE | Admit: 2022-07-15 | Discharge: 2022-07-16 | DRG: 473 | Disposition: A | Discharge status: Home / Self Care | Payer: Medicare HMO | Attending: Orthopedic Surgery | Admitting: Orthopedic Surgery

## 2022-07-15 DIAGNOSIS — Z9989 Dependence on other enabling machines and devices: Secondary | ICD-10-CM | POA: Diagnosis not present

## 2022-07-15 DIAGNOSIS — E039 Hypothyroidism, unspecified: Secondary | ICD-10-CM | POA: Diagnosis present

## 2022-07-15 DIAGNOSIS — M797 Fibromyalgia: Secondary | ICD-10-CM | POA: Diagnosis present

## 2022-07-15 DIAGNOSIS — M898X8 Other specified disorders of bone, other site: Secondary | ICD-10-CM | POA: Diagnosis not present

## 2022-07-15 DIAGNOSIS — Z87891 Personal history of nicotine dependence: Secondary | ICD-10-CM | POA: Diagnosis not present

## 2022-07-15 DIAGNOSIS — R1310 Dysphagia, unspecified: Secondary | ICD-10-CM | POA: Diagnosis not present

## 2022-07-15 DIAGNOSIS — M5412 Radiculopathy, cervical region: Principal | ICD-10-CM | POA: Diagnosis present

## 2022-07-15 DIAGNOSIS — G4733 Obstructive sleep apnea (adult) (pediatric): Secondary | ICD-10-CM | POA: Diagnosis present

## 2022-07-15 DIAGNOSIS — M4802 Spinal stenosis, cervical region: Secondary | ICD-10-CM

## 2022-07-15 DIAGNOSIS — Z79899 Other long term (current) drug therapy: Secondary | ICD-10-CM

## 2022-07-15 DIAGNOSIS — K219 Gastro-esophageal reflux disease without esophagitis: Secondary | ICD-10-CM | POA: Diagnosis present

## 2022-07-15 DIAGNOSIS — M4812 Ankylosing hyperostosis [Forestier], cervical region: Secondary | ICD-10-CM | POA: Diagnosis present

## 2022-07-15 DIAGNOSIS — M1990 Unspecified osteoarthritis, unspecified site: Secondary | ICD-10-CM | POA: Diagnosis present

## 2022-07-15 DIAGNOSIS — E785 Hyperlipidemia, unspecified: Secondary | ICD-10-CM | POA: Diagnosis present

## 2022-07-15 DIAGNOSIS — M4602 Spinal enthesopathy, cervical region: Secondary | ICD-10-CM | POA: Diagnosis not present

## 2022-07-15 DIAGNOSIS — M4722 Other spondylosis with radiculopathy, cervical region: Secondary | ICD-10-CM | POA: Diagnosis not present

## 2022-07-15 DIAGNOSIS — Z888 Allergy status to other drugs, medicaments and biological substances status: Secondary | ICD-10-CM | POA: Diagnosis not present

## 2022-07-15 DIAGNOSIS — E669 Obesity, unspecified: Secondary | ICD-10-CM | POA: Diagnosis not present

## 2022-07-15 DIAGNOSIS — M2578 Osteophyte, vertebrae: Secondary | ICD-10-CM | POA: Diagnosis present

## 2022-07-15 DIAGNOSIS — Z6833 Body mass index (BMI) 33.0-33.9, adult: Secondary | ICD-10-CM | POA: Diagnosis not present

## 2022-07-15 DIAGNOSIS — I10 Essential (primary) hypertension: Secondary | ICD-10-CM | POA: Diagnosis not present

## 2022-07-15 DIAGNOSIS — M4322 Fusion of spine, cervical region: Secondary | ICD-10-CM | POA: Diagnosis not present

## 2022-07-15 HISTORY — PX: ANTERIOR CERVICAL DECOMP/DISCECTOMY FUSION: SHX1161

## 2022-07-15 SURGERY — ANTERIOR CERVICAL DECOMPRESSION/DISCECTOMY FUSION 1 LEVEL
Anesthesia: General | Site: Spine Cervical

## 2022-07-15 MED ORDER — ACETAMINOPHEN 325 MG PO TABS
650.0000 mg | ORAL_TABLET | ORAL | Status: DC | PRN
  Administered 2022-07-15 – 2022-07-16 (×2): 650 mg via ORAL
  Filled 2022-07-15 (×2): qty 2

## 2022-07-15 MED ORDER — BUPIVACAINE HCL (PF) 0.25 % IJ SOLN
INTRAMUSCULAR | Status: AC
  Filled 2022-07-15: qty 30

## 2022-07-15 MED ORDER — AMISULPRIDE (ANTIEMETIC) 5 MG/2ML IV SOLN: 10.0000 mg | Freq: Once | INTRAVENOUS | Status: DC | PRN

## 2022-07-15 MED ORDER — SODIUM CHLORIDE 0.9% FLUSH: 3.0000 mL | INTRAVENOUS | Status: DC | PRN

## 2022-07-15 MED ORDER — MIDAZOLAM HCL 2 MG/2ML IJ SOLN
INTRAMUSCULAR | Status: DC | PRN
  Administered 2022-07-15: 2 mg via INTRAVENOUS

## 2022-07-15 MED ORDER — ONDANSETRON HCL 4 MG/2ML IJ SOLN: 4.0000 mg | Freq: Four times a day (QID) | INTRAMUSCULAR | Status: DC | PRN

## 2022-07-15 MED ORDER — CEFAZOLIN SODIUM-DEXTROSE 2-4 GM/100ML-% IV SOLN
INTRAVENOUS | Status: AC
  Filled 2022-07-15: qty 100

## 2022-07-15 MED ORDER — SURGIFLO WITH THROMBIN (HEMOSTATIC MATRIX KIT) OPTIME
TOPICAL | Status: DC | PRN
  Administered 2022-07-15 (×2): 1 via TOPICAL

## 2022-07-15 MED ORDER — LACTATED RINGERS IV SOLN: INTRAVENOUS | Status: DC

## 2022-07-15 MED ORDER — BUPIVACAINE HCL 0.25 % IJ SOLN
INTRAMUSCULAR | Status: DC | PRN
  Administered 2022-07-15: 9 mL

## 2022-07-15 MED ORDER — SODIUM CHLORIDE 0.9 % IV SOLN: 250.0000 mL | INTRAVENOUS | Status: DC

## 2022-07-15 MED ORDER — ORAL CARE MOUTH RINSE: 15.0000 mL | Freq: Once | OROMUCOSAL | Status: AC

## 2022-07-15 MED ORDER — ONDANSETRON HCL 4 MG/2ML IJ SOLN
INTRAMUSCULAR | Status: DC | PRN
  Administered 2022-07-15: 4 mg via INTRAVENOUS

## 2022-07-15 MED ORDER — ROCURONIUM BROMIDE 10 MG/ML (PF) SYRINGE
PREFILLED_SYRINGE | INTRAVENOUS | Status: DC | PRN
  Administered 2022-07-15: 20 mg via INTRAVENOUS
  Administered 2022-07-15: 30 mg via INTRAVENOUS
  Administered 2022-07-15: 20 mg via INTRAVENOUS
  Administered 2022-07-15: 70 mg via INTRAVENOUS
  Administered 2022-07-15: 20 mg via INTRAVENOUS
  Administered 2022-07-15: 10 mg via INTRAVENOUS

## 2022-07-15 MED ORDER — KETAMINE HCL 50 MG/5ML IJ SOSY
PREFILLED_SYRINGE | INTRAMUSCULAR | Status: AC
  Filled 2022-07-15: qty 5

## 2022-07-15 MED ORDER — LEVOTHYROXINE SODIUM 25 MCG PO TABS
50.0000 ug | ORAL_TABLET | Freq: Every day | ORAL | Status: DC
  Administered 2022-07-16: 50 ug via ORAL
  Filled 2022-07-15: qty 2

## 2022-07-15 MED ORDER — LIDOCAINE 2% (20 MG/ML) 5 ML SYRINGE
INTRAMUSCULAR | Status: DC | PRN
  Administered 2022-07-15: 80 mg via INTRAVENOUS

## 2022-07-15 MED ORDER — METHOCARBAMOL 500 MG PO TABS: 500.0000 mg | ORAL_TABLET | Freq: Three times a day (TID) | ORAL | 0 refills | Status: AC | PRN

## 2022-07-15 MED ORDER — METHOCARBAMOL 1000 MG/10ML IJ SOLN: 500.0000 mg | Freq: Four times a day (QID) | INTRAVENOUS | Status: DC | PRN

## 2022-07-15 MED ORDER — CHLORHEXIDINE GLUCONATE 0.12 % MT SOLN
15.0000 mL | Freq: Once | OROMUCOSAL | Status: AC
  Administered 2022-07-15: 15 mL via OROMUCOSAL
  Filled 2022-07-15: qty 15

## 2022-07-15 MED ORDER — ROSUVASTATIN CALCIUM 5 MG PO TABS: 10.0000 mg | ORAL_TABLET | Freq: Every day | ORAL | Status: DC

## 2022-07-15 MED ORDER — TRANEXAMIC ACID-NACL 1000-0.7 MG/100ML-% IV SOLN
1000.0000 mg | INTRAVENOUS | Status: AC
  Administered 2022-07-15: 1000 mg via INTRAVENOUS

## 2022-07-15 MED ORDER — EPINEPHRINE PF 1 MG/ML IJ SOLN
INTRAMUSCULAR | Status: AC
  Filled 2022-07-15: qty 1

## 2022-07-15 MED ORDER — HYDROMORPHONE HCL 1 MG/ML IJ SOLN: 0.2500 mg | INTRAMUSCULAR | Status: DC | PRN

## 2022-07-15 MED ORDER — SUGAMMADEX SODIUM 200 MG/2ML IV SOLN
INTRAVENOUS | Status: DC | PRN
  Administered 2022-07-15: 200 mg via INTRAVENOUS

## 2022-07-15 MED ORDER — HYDROMORPHONE HCL 1 MG/ML IJ SOLN
INTRAMUSCULAR | Status: AC
  Filled 2022-07-15: qty 0.5

## 2022-07-15 MED ORDER — KETAMINE HCL 10 MG/ML IJ SOLN
INTRAMUSCULAR | Status: DC | PRN
  Administered 2022-07-15: 15 mg via INTRAVENOUS
  Administered 2022-07-15: 25 mg via INTRAVENOUS
  Administered 2022-07-15: 10 mg via INTRAVENOUS

## 2022-07-15 MED ORDER — POLYETHYLENE GLYCOL 3350 17 G PO PACK: 17.0000 g | PACK | Freq: Every day | ORAL | Status: DC | PRN

## 2022-07-15 MED ORDER — PROPOFOL 10 MG/ML IV BOLUS
INTRAVENOUS | Status: AC
  Filled 2022-07-15: qty 20

## 2022-07-15 MED ORDER — CEFAZOLIN SODIUM-DEXTROSE 2-4 GM/100ML-% IV SOLN
2.0000 g | INTRAVENOUS | Status: AC
  Administered 2022-07-15: 2 g via INTRAVENOUS

## 2022-07-15 MED ORDER — TRANEXAMIC ACID-NACL 1000-0.7 MG/100ML-% IV SOLN
INTRAVENOUS | Status: AC
  Filled 2022-07-15: qty 100

## 2022-07-15 MED ORDER — HYDROCHLOROTHIAZIDE 25 MG PO TABS: 25.0000 mg | ORAL_TABLET | Freq: Every day | ORAL | Status: DC

## 2022-07-15 MED ORDER — DEXAMETHASONE 4 MG PO TABS
4.0000 mg | ORAL_TABLET | Freq: Four times a day (QID) | ORAL | Status: DC
  Administered 2022-07-16: 4 mg via ORAL
  Filled 2022-07-15: qty 1

## 2022-07-15 MED ORDER — METOPROLOL TARTRATE 12.5 MG HALF TABLET
12.5000 mg | ORAL_TABLET | Freq: Two times a day (BID) | ORAL | Status: DC
  Administered 2022-07-15: 12.5 mg via ORAL
  Filled 2022-07-15: qty 1

## 2022-07-15 MED ORDER — FENTANYL CITRATE (PF) 250 MCG/5ML IJ SOLN
INTRAMUSCULAR | Status: DC | PRN
  Administered 2022-07-15: 150 ug via INTRAVENOUS

## 2022-07-15 MED ORDER — 0.9 % SODIUM CHLORIDE (POUR BTL) OPTIME
TOPICAL | Status: DC | PRN
  Administered 2022-07-15: 1000 mL

## 2022-07-15 MED ORDER — HYDROMORPHONE HCL 1 MG/ML IJ SOLN: 1.0000 mg | INTRAMUSCULAR | Status: DC | PRN

## 2022-07-15 MED ORDER — METHOCARBAMOL 500 MG PO TABS
500.0000 mg | ORAL_TABLET | Freq: Four times a day (QID) | ORAL | Status: DC | PRN
  Administered 2022-07-15 – 2022-07-16 (×2): 500 mg via ORAL
  Filled 2022-07-15 (×2): qty 1

## 2022-07-15 MED ORDER — ONDANSETRON HCL 4 MG PO TABS: 4.0000 mg | ORAL_TABLET | Freq: Three times a day (TID) | ORAL | 0 refills | Status: AC | PRN

## 2022-07-15 MED ORDER — PHENOL 1.4 % MT LIQD: 1.0000 | OROMUCOSAL | Status: DC | PRN

## 2022-07-15 MED ORDER — MIDAZOLAM HCL 2 MG/2ML IJ SOLN
INTRAMUSCULAR | Status: AC
  Filled 2022-07-15: qty 2

## 2022-07-15 MED ORDER — GABAPENTIN 300 MG PO CAPS
600.0000 mg | ORAL_CAPSULE | Freq: Three times a day (TID) | ORAL | Status: DC
  Administered 2022-07-15 (×2): 600 mg via ORAL
  Filled 2022-07-15 (×2): qty 2

## 2022-07-15 MED ORDER — HYDROMORPHONE HCL 1 MG/ML IJ SOLN
INTRAMUSCULAR | Status: DC | PRN
  Administered 2022-07-15: .5 mg via INTRAVENOUS

## 2022-07-15 MED ORDER — ROCURONIUM BROMIDE 10 MG/ML (PF) SYRINGE
PREFILLED_SYRINGE | INTRAVENOUS | Status: AC
  Filled 2022-07-15: qty 40

## 2022-07-15 MED ORDER — DULOXETINE HCL 30 MG PO CPEP: 60.0000 mg | ORAL_CAPSULE | Freq: Every day | ORAL | Status: DC

## 2022-07-15 MED ORDER — THROMBIN 20000 UNITS EX SOLR
CUTANEOUS | Status: AC
  Filled 2022-07-15: qty 20000

## 2022-07-15 MED ORDER — SODIUM CHLORIDE 0.9% FLUSH
3.0000 mL | Freq: Two times a day (BID) | INTRAVENOUS | Status: DC
  Administered 2022-07-15: 3 mL via INTRAVENOUS

## 2022-07-15 MED ORDER — PROPOFOL 10 MG/ML IV BOLUS
INTRAVENOUS | Status: DC | PRN
  Administered 2022-07-15: 150 mg via INTRAVENOUS

## 2022-07-15 MED ORDER — DEXAMETHASONE SODIUM PHOSPHATE 10 MG/ML IJ SOLN
INTRAMUSCULAR | Status: DC | PRN
  Administered 2022-07-15: 5 mg via INTRAVENOUS

## 2022-07-15 MED ORDER — OXYCODONE-ACETAMINOPHEN 10-325 MG PO TABS: 1.0000 | ORAL_TABLET | Freq: Four times a day (QID) | ORAL | 0 refills | Status: AC | PRN

## 2022-07-15 MED ORDER — ACETAMINOPHEN 650 MG RE SUPP: 650.0000 mg | RECTAL | Status: DC | PRN

## 2022-07-15 MED ORDER — OXYCODONE HCL 5 MG/5ML PO SOLN: 5.0000 mg | Freq: Once | ORAL | Status: DC | PRN

## 2022-07-15 MED ORDER — EPHEDRINE 5 MG/ML INJ
INTRAVENOUS | Status: AC
  Filled 2022-07-15: qty 5

## 2022-07-15 MED ORDER — EPHEDRINE SULFATE-NACL 50-0.9 MG/10ML-% IV SOSY
PREFILLED_SYRINGE | INTRAVENOUS | Status: DC | PRN
  Administered 2022-07-15: 10 mg via INTRAVENOUS

## 2022-07-15 MED ORDER — DEXAMETHASONE SODIUM PHOSPHATE 4 MG/ML IJ SOLN
4.0000 mg | Freq: Four times a day (QID) | INTRAMUSCULAR | Status: DC
  Administered 2022-07-15 (×2): 4 mg via INTRAVENOUS
  Filled 2022-07-15 (×2): qty 1

## 2022-07-15 MED ORDER — EPINEPHRINE 1 MG/ML IJ SOLN
INTRAMUSCULAR | Status: DC | PRN
  Administered 2022-07-15: .15 mL

## 2022-07-15 MED ORDER — OXYCODONE HCL 5 MG PO TABS
10.0000 mg | ORAL_TABLET | ORAL | Status: DC | PRN
  Administered 2022-07-15 – 2022-07-16 (×4): 10 mg via ORAL
  Filled 2022-07-15 (×4): qty 2

## 2022-07-15 MED ORDER — OXYCODONE HCL 5 MG PO TABS: 5.0000 mg | ORAL_TABLET | Freq: Once | ORAL | Status: DC | PRN

## 2022-07-15 MED ORDER — AMLODIPINE BESYLATE 5 MG PO TABS: 2.5000 mg | ORAL_TABLET | Freq: Every day | ORAL | Status: DC

## 2022-07-15 MED ORDER — MAGNESIUM CITRATE PO SOLN: 1.0000 | Freq: Once | ORAL | Status: DC | PRN

## 2022-07-15 MED ORDER — ONDANSETRON HCL 4 MG PO TABS: 4.0000 mg | ORAL_TABLET | Freq: Four times a day (QID) | ORAL | Status: DC | PRN

## 2022-07-15 MED ORDER — GLYCOPYRROLATE 1 MG PO TABS
2.0000 mg | ORAL_TABLET | Freq: Every day | ORAL | Status: DC
  Filled 2022-07-15: qty 2

## 2022-07-15 MED ORDER — FENTANYL CITRATE (PF) 250 MCG/5ML IJ SOLN
INTRAMUSCULAR | Status: AC
  Filled 2022-07-15: qty 5

## 2022-07-15 MED ORDER — ONDANSETRON HCL 4 MG/2ML IJ SOLN: 4.0000 mg | Freq: Once | INTRAMUSCULAR | Status: DC | PRN

## 2022-07-15 MED ORDER — CEFAZOLIN SODIUM-DEXTROSE 1-4 GM/50ML-% IV SOLN
1.0000 g | Freq: Three times a day (TID) | INTRAVENOUS | Status: AC
  Administered 2022-07-15 – 2022-07-16 (×2): 1 g via INTRAVENOUS
  Filled 2022-07-15 (×2): qty 50

## 2022-07-15 MED ORDER — MENTHOL 3 MG MT LOZG: 1.0000 | LOZENGE | OROMUCOSAL | Status: DC | PRN

## 2022-07-15 MED ORDER — OXYCODONE HCL 5 MG PO TABS: 5.0000 mg | ORAL_TABLET | ORAL | Status: DC | PRN

## 2022-07-15 SURGICAL SUPPLY — 72 items
BAG COUNTER SPONGE SURGICOUNT (BAG) ×1 IMPLANT
BLADE CLIPPER SURG (BLADE) IMPLANT
CABLE BIPOLOR RESECTION CORD (MISCELLANEOUS) ×1 IMPLANT
CANISTER SUCT 3000ML PPV (MISCELLANEOUS) ×1 IMPLANT
CLSR STERI-STRIP ANTIMIC 1/2X4 (GAUZE/BANDAGES/DRESSINGS) ×1 IMPLANT
CNTNR URN SCR LID CUP LEK RST (MISCELLANEOUS) IMPLANT
COLLAR CERV LO CONTOUR FIRM DE (SOFTGOODS) IMPLANT
CONT SPEC 4OZ STRL OR WHT (MISCELLANEOUS) ×1
COVER MAYO STAND STRL (DRAPES) ×3 IMPLANT
COVER SURGICAL LIGHT HANDLE (MISCELLANEOUS) ×2 IMPLANT
DEVICE FUSION NANLCK 8 6 DE (Cage) IMPLANT
DRAIN CHANNEL 15F RND FF W/TCR (WOUND CARE) IMPLANT
DRAIN JP 10F RND RADIO (DRAIN) IMPLANT
DRAPE C-ARM 42X72 X-RAY (DRAPES) ×1 IMPLANT
DRAPE POUCH INSTRU U-SHP 10X18 (DRAPES) ×1 IMPLANT
DRAPE SURG 17X23 STRL (DRAPES) ×1 IMPLANT
DRAPE U-SHAPE 47X51 STRL (DRAPES) ×1 IMPLANT
DRSG OPSITE POSTOP 3X4 (GAUZE/BANDAGES/DRESSINGS) ×1 IMPLANT
DURAPREP 26ML APPLICATOR (WOUND CARE) ×1 IMPLANT
ELECT BLADE 4.0 EZ CLEAN MEGAD (MISCELLANEOUS) ×1
ELECT COATED BLADE 2.86 ST (ELECTRODE) ×1 IMPLANT
ELECT PENCIL ROCKER SW 15FT (MISCELLANEOUS) ×1 IMPLANT
ELECT REM PT RETURN 9FT ADLT (ELECTROSURGICAL) ×1
ELECTRODE BLDE 4.0 EZ CLN MEGD (MISCELLANEOUS) IMPLANT
ELECTRODE REM PT RTRN 9FT ADLT (ELECTROSURGICAL) ×1 IMPLANT
EVACUATOR SILICONE 100CC (DRAIN) IMPLANT
FUSION TCS NANOLOCK 8 6 DE (Cage) ×1 IMPLANT
GAUZE SPONGE 4X4 12PLY STRL (GAUZE/BANDAGES/DRESSINGS) IMPLANT
GLOVE BIO SURGEON STRL SZ 6.5 (GLOVE) ×1 IMPLANT
GLOVE BIOGEL PI IND STRL 6.5 (GLOVE) ×1 IMPLANT
GLOVE BIOGEL PI IND STRL 8.5 (GLOVE) ×1 IMPLANT
GLOVE SS BIOGEL STRL SZ 8.5 (GLOVE) ×1 IMPLANT
GOWN STRL REUS W/ TWL LRG LVL3 (GOWN DISPOSABLE) ×1 IMPLANT
GOWN STRL REUS W/TWL 2XL LVL3 (GOWN DISPOSABLE) ×1 IMPLANT
GOWN STRL REUS W/TWL LRG LVL3 (GOWN DISPOSABLE) ×1
KIT BASIN OR (CUSTOM PROCEDURE TRAY) ×1 IMPLANT
KIT TURNOVER KIT B (KITS) ×1 IMPLANT
NDL 18GX1X1/2 (RX/OR ONLY) (NEEDLE) IMPLANT
NDL FILTER BLUNT 18X1 1/2 (NEEDLE) IMPLANT
NDL SPNL 18GX3.5 QUINCKE PK (NEEDLE) ×1 IMPLANT
NEEDLE 18GX1X1/2 (RX/OR ONLY) (NEEDLE) ×1 IMPLANT
NEEDLE FILTER BLUNT 18X1 1/2 (NEEDLE) ×1 IMPLANT
NEEDLE HYPO 22GX1.5 SAFETY (NEEDLE) ×1 IMPLANT
NEEDLE SPNL 18GX3.5 QUINCKE PK (NEEDLE) ×1 IMPLANT
NS IRRIG 1000ML POUR BTL (IV SOLUTION) ×1 IMPLANT
PACK ORTHO CERVICAL (CUSTOM PROCEDURE TRAY) ×1 IMPLANT
PACK UNIVERSAL I (CUSTOM PROCEDURE TRAY) ×1 IMPLANT
PAD ARMBOARD 7.5X6 YLW CONV (MISCELLANEOUS) ×2 IMPLANT
PATTIES SURGICAL .25X.25 (GAUZE/BANDAGES/DRESSINGS) IMPLANT
PLATE LOCK ENDO TCS (Plate) ×1 IMPLANT
PLATE LOCK ENDO TCS F/COVER (Plate) IMPLANT
POSITIONER HEAD DONUT 9IN (MISCELLANEOUS) ×1 IMPLANT
PUTTY DBX 2.5CC (Putty) ×1 IMPLANT
PUTTY DBX 2.5CC DEPUY (Putty) IMPLANT
RESTRAINT LIMB HOLDER UNIV (RESTRAINTS) ×1 IMPLANT
SCREW 3.8X16MM (Screw) IMPLANT
SPONGE INTESTINAL PEANUT (DISPOSABLE) ×1 IMPLANT
SPONGE SURGIFOAM ABS GEL SZ50 (HEMOSTASIS) ×1 IMPLANT
SURGIFLO W/THROMBIN 8M KIT (HEMOSTASIS) IMPLANT
SUT BONE WAX W31G (SUTURE) ×1 IMPLANT
SUT MNCRL AB 3-0 PS2 27 (SUTURE) ×1 IMPLANT
SUT VIC AB 2-0 CT1 18 (SUTURE) ×1 IMPLANT
SYR BULB IRRIG 60ML STRL (SYRINGE) ×1 IMPLANT
SYR CONTROL 10ML LL (SYRINGE) ×1 IMPLANT
SYR TB 1ML LUER SLIP (SYRINGE) IMPLANT
TAPE CLOTH 4X10 WHT NS (GAUZE/BANDAGES/DRESSINGS) ×1 IMPLANT
TAPE CLOTH SOFT 2X10 (GAUZE/BANDAGES/DRESSINGS) IMPLANT
TAPE UMBILICAL 1/8 X36 TWILL (MISCELLANEOUS) IMPLANT
TAPE UMBILICAL COTTON 1/8X30 (MISCELLANEOUS) ×1 IMPLANT
TOWEL GREEN STERILE (TOWEL DISPOSABLE) ×1 IMPLANT
TOWEL GREEN STERILE FF (TOWEL DISPOSABLE) ×1 IMPLANT
WATER STERILE IRR 1000ML POUR (IV SOLUTION) ×1 IMPLANT

## 2022-07-15 NOTE — Op Note (Signed)
OPERATIVE REPORT  DATE OF SURGERY: 07/15/2022  PATIENT NAME:  [name] MRN: [id number] DOB: January 16, 1951  PCP: Antony Contras, MD  PRE-OPERATIVE DIAGNOSIS: Foraminal stenosis with radiculopathy C3-4.  Diffuse idiopathic skeletal hyperostosis C4-7 with large anterior exostosis contributing to dysphagia  POST-OPERATIVE DIAGNOSIS: Same  PROCEDURE:   1.  ACDF C3-4 2.  Removal of anterior exostosis C4-7  SURGEON:  Melina Schools, MD  PHYSICIAN ASSISTANT: None  ANESTHESIA:   General  EBL: [id number]   Complications: None  Implants: Titan 0 profile intervertebral cage 8 x 18 x 16 (large) with 3.8 x 16 mm locking screws  Graft: Autograft from exostosis removal with DBX  BRIEF HISTORY: [name] is a 73 y.o. male who has had significant neck pain, loss of range of motion, and dysphagia for some time.  Imaging demonstrated a large anterior exostosis causing autofusion C4-7 and near complete fusion at C3-4.  Patient also was noted had severe foraminal stenosis at C3-4.  Attempts at conservative management failed to alleviate his pain or improve his quality of life.  As result we elected to move forward with surgery.  All appropriate risks, benefits, and alternatives were discussed with the patient and consent was obtained.  PROCEDURE DETAILS: Patient was brought into the operating room and was properly positioned on the operating room table.  After induction with general anesthesia the patient was endotracheally intubated.  A timeout was taken to confirm all important data: including patient, procedure, and the level. Teds, SCD's were applied.   An incision was marked on the left side of the neck in line with a standard Smith-Robinson approach.  The incision site was infiltrated with quarter percent Marcaine with epinephrine and I incised the skin and sharply dissected down to the platysma.  The platysma was isolated and separated and I continued dissecting sharply along the medial  border the sternocleidomastoid into the deep cervical fascia.  The omohyoid muscle was identified and gently swept to the right.  I then identified the esophagus and gently swept into the right.  There were large anterior bone spurs consistent with what was seen on the preoperative imaging studies.  I was able to mobilize the remainder of the prevertebral fascia to expose the anterior surface of the cervical spine from C3-C7.  The large flowing exostoses were opposed.  Using an osteotome I was able to resect the large osteophyte at C4-5.  Continue to work toward C7 using the osteotomes, double-action Leksell rongeurs, and Kerrison rongeurs to remove the entire osteophyte from C4-C7.  I then used the 3 mm Kerrison rongeur to remove the thin remaining bone layer just above the disc space C4-5, C5-6, and C6-7.  I could now visualize the anterior annulus at each of these levels.  Imaging clearly showed complete resection of all of the osteophyte that was seen preoperatively.  I then used bone wax to seal the bleeding bone edges.  I then turned my attention to the C3-4 level  At this point the Caspar retractor blades were placed the longus coli muscle and gently expanded to expose the C3-4 level.  The anterior bone spur was resected using osteotome and rongeurs.  An annulotomy was then performed with a 15 blade scalpel and the bulk of the disc material was removed.  Distraction pins were placed into the body of C3 and C4.  The intervertebral space was distracted with a lamina spreader and locked in position with the distraction pin set.  Using fine neural curettes I  removed the remaining portion of the disc material until I can see the posterior aspect of the vertebral body.  I then used a fine nerve hook to dissect through the posterior annulus to develop a plane between the thecal sac and the PLL.  I used a 1 mm Kerrison rongeur to resect the remaining portion of the disc and posterior annulus as well as the  posterior longitudinal ligament.  This allowed me to easily undercut the uncovertebral joints and decompress the foramen.  At this point I was able to freely pass my nerve hook under the right and left uncovertebral joints and behind the vertebral body of C3 and C4.  At this point I was pleased with my overall decompression.    With the discectomy complete I move forward with the instrumentation.  Using the trial implants I elected to use the 8 large lordotic implant.  I rasped the endplates and make sure had bleeding subchondral bone.  The cage was then packed with autograft bone that was saved from the bone spur that had resected.  The wound was then copiously irrigated with saline.  I then obtained the cage and gently malleted into position.  Once she was properly seated I used the awl to breach the cortex and then put the 2.8 x 60 mm locking screw through the cage and into the vertebral body of C3 and into the the body of C4.  Both screws had excellent purchase.  At this point I was very pleased with the fixation that the intervertebral cage attained.  The locking plate was then secured to prevent backout of the screws.  At this point final x-rays were taken which demonstrated satisfactory overall position of the ACDF cage as well as significant improvement in the appearance of the cervical spine with removal of the flowing osteophytes.  At this point I irrigated copiously normal saline and make sure that hemostasis.  All of the levels were evaluated and any bleeding bony surface was sealed with bone wax.  After final irrigation the distraction pins were removed and those holes were sealed with bone wax.  Once hemostasis was confirmed I returned the trach and esophagus to midline and closed the platysma with interrupted 2-0 Vicryl suture.  The skin was closed with a 3-0 Monocryl.  Steri-Strips and dry dressings were applied the patient was extubated transferred to PACU without incident.  The end of the case  all needle sponge counts were correct.  There were no adverse intraoperative events.  Melina Schools, MD 07/15/2022 2:28 PM

## 2022-07-15 NOTE — Anesthesia Procedure Notes (Signed)
Procedure Name: Intubation Date/Time: 07/15/2022 11:13 AM  Performed by: Elvin So, CRNAPre-anesthesia Checklist: Patient identified, Emergency Drugs available, Suction available and Patient being monitored Patient Re-evaluated:Patient Re-evaluated prior to induction Oxygen Delivery Method: Circle System Utilized Preoxygenation: Pre-oxygenation with 100% oxygen Induction Type: IV induction Ventilation: Mask ventilation without difficulty Laryngoscope Size: Glidescope and 4 Grade View: Grade I Tube type: Oral Tube size: 7.5 mm Number of attempts: 1 Airway Equipment and Method: Stylet and Oral airway Placement Confirmation: ETT inserted through vocal cords under direct vision, positive ETCO2 and breath sounds checked- equal and bilateral Secured at: 22 cm Tube secured with: Tape Dental Injury: Teeth and Oropharynx as per pre-operative assessment

## 2022-07-15 NOTE — Brief Op Note (Signed)
07/15/2022  2:41 PM  PATIENT:  [name]  72 y.o. 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, DISH with auto fusion C4-7  POST-OPERATIVE DIAGNOSIS:  Cervical spondylotic radiculopathy C3-4, DISH with auto fusion C4-7  PROCEDURE:  Procedure(s): ANTERIOR CERVICAL DECOMPRESSION/DISCECTOMY FUSION 1 LEVELC3-4, REMOVAL OF EXOSTOSIS C4-7 (N/A)  SURGEON:  Surgeon(s) and Role:    Melina Schools, MD - Primary  PHYSICIAN ASSISTANT:   ASSISTANTS: none   ANESTHESIA:   general  EBL:  150 mL   BLOOD ADMINISTERED:none  DRAINS: none   LOCAL MEDICATIONS USED:  MARCAINE     SPECIMEN:  No Specimen  DISPOSITION OF SPECIMEN:  N/A  COUNTS:  YES  TOURNIQUET:  * No tourniquets in log *  DICTATION: .Dragon Dictation  PLAN OF CARE: Admit for overnight observation  PATIENT DISPOSITION:  PACU - hemodynamically stable.

## 2022-07-15 NOTE — Anesthesia Postprocedure Evaluation (Signed)
Anesthesia Post Note  Patient: AIRIC SALMELA  Procedure(s) Performed: ANTERIOR CERVICAL DECOMPRESSION/DISCECTOMY FUSION 1 LEVELC3-4, REMOVAL OF EXOSTOSIS C4-7 (Spine Cervical)     Patient location during evaluation: PACU Anesthesia Type: General Level of consciousness: awake and alert and oriented Pain management: pain level controlled Vital Signs Assessment: post-procedure vital signs reviewed and stable Respiratory status: spontaneous breathing, nonlabored ventilation and respiratory function stable Cardiovascular status: blood pressure returned to baseline and stable Postop Assessment: no apparent nausea or vomiting Anesthetic complications: no   No notable events documented.  Last Vitals:  Vitals:   07/15/22 1445 07/15/22 1500  BP: (!) 155/85 (!) 158/86  Pulse: 89 97  Resp: 12 13  Temp: 36.5 C   SpO2: 97% 94%    Last Pain:  Vitals:   07/15/22 1500  TempSrc:   PainSc: 0-No pain                 Camila Norville A.

## 2022-07-15 NOTE — H&P (Signed)
History:  [name] is a very pleasant 72 year old gentleman with significant neck pain and dysphagia. Imaging studies demonstrate large anterior exostosis C4-7 along with significant right foraminal stenosis at C3-4 affecting the exiting C4 nerve root. As result of the failure of conservative management we have elected to move forward with an ACDF at C3-4 with removal of anterior exostosis C4-7.  Past Medical History:  Diagnosis Date   Arthritis    Carpal tunnel syndrome of right wrist 12/22/2012   Fibromyalgia    GERD (gastroesophageal reflux disease)    Headache(784.0)    sinus headaches; also dull headache almost every day, states related to fibromyalgia   Hyperlipidemia    Hypertension    under control with meds., has been on med. since 2008   Hypothyroidism    Irritable bowel syndrome    no current med.   Otitis media 12/25/2012   started antibiotic 12/25/2012 x 14 days   Seasonal allergies    Sleep apnea    uses CPAP nightly    Allergies  Allergen Reactions   Amlodipine Swelling    Can tolerate 2.5 mg    Simvastatin Other (See Comments)    myalgias    No current facility-administered medications on file prior to encounter.   Current Outpatient Medications on File Prior to Encounter  Medication Sig Dispense Refill   acetaminophen (TYLENOL) 500 MG tablet Take 1,000 mg by mouth 3 (three) times daily.     amLODipine (NORVASC) 2.5 MG tablet Take 2.5 mg by mouth daily.      ascorbic acid (VITAMIN C) 500 MG tablet Take 500 mg by mouth 2 (two) times daily.     Cholecalciferol (VITAMIN D PO) Take 2,000 Units by mouth daily.     CYMBALTA 60 MG capsule Take 60 mg by mouth daily.      diphenhydrAMINE (BENADRYL) 25 MG tablet Take 75 mg by mouth in the morning, at noon, in the evening, and at bedtime.     fenofibrate 160 MG tablet Take 1 tablet (160 mg total) by mouth daily. NEED OV. (Patient taking differently: Take 80 mg by mouth in the morning and at bedtime.) 30 tablet 0    fluticasone (FLONASE) 50 MCG/ACT nasal spray Place 1 spray into both nostrils in the morning and at bedtime.     gabapentin (NEURONTIN) 600 MG tablet Take 600 mg by mouth 3 (three) times daily.     glycopyrrolate (ROBINUL) 2 MG tablet Take 2 mg by mouth daily.     hydrochlorothiazide (HYDRODIURIL) 25 MG tablet Take 25 mg by mouth daily.     ibuprofen (ADVIL,MOTRIN) 200 MG tablet Take 600 mg by mouth in the morning, at noon, in the evening, and at bedtime.     levothyroxine (SYNTHROID) 50 MCG tablet Take 50 mcg by mouth daily before breakfast.     metoprolol tartrate (LOPRESSOR) 25 MG tablet take 1/2 tablet by mouth twice a day 30 tablet 6   Misc Natural Products (OSTEO BI-FLEX TRIPLE STRENGTH PO) Take 2 tablets by mouth at bedtime.     Multiple Vitamin (MULTIVITAMIN) tablet Take 1 tablet by mouth daily.     omeprazole (PRILOSEC) 40 MG capsule Take 40 mg by mouth daily.     rosuvastatin (CRESTOR) 10 MG tablet Take 10 mg by mouth daily.      tiZANidine (ZANAFLEX) 2 MG tablet Take 1 tablet by mouth in the morning and at bedtime.     zolpidem (AMBIEN) 10 MG tablet Take 10 mg  by mouth at bedtime.     fenofibrate 160 MG tablet take 1 tablet by mouth once daily (Patient not taking: Reported on 07/05/2022) 30 tablet 2   NON FORMULARY Pt uses a c-pap nightly      Physical Exam: Vitals:   07/15/22 0843  BP: (!) 155/81  Pulse: 83  Resp: 18  Temp: 98.3 F (36.8 C)  SpO2: 99%   Body mass index is 33.97 kg/m. Clinical exam: [name] is a pleasant individual, who appears younger than their stated age.  He is alert and orientated 3.  No shortness of breath, chest pain.  Abdomen is soft and non-tender, negative loss of bowel and bladder control, no rebound tenderness.  Negative: skin lesions abrasions contusions  Peripheral pulses: 2+ peripheral pulses bilaterally. LE compartments are: Soft and nontender.  Gait pattern: [default value]  Assistive devices: None  Neuro: Positive numbness and  dysesthesias in the left C6 and C7 dermatome. Negative Spurling sign, negative Hoffman test. 5/5 motor strength in the upper extremity bilaterally  Musculoskeletal: Patient has limited cervical range of motion. Sagittal alignment demonstrates a positive position of the cervical spine  Imaging: X-rays of the cervical spine are essentially unchanged from those from approximately 11 months ago. Disc space height generally well maintained without any significant collapse. No fracture is seen. Large bridging osteophyte complex in the anterior cervical spine C4-6. C7 not well visualized.  Cervical MRI: completed on 06/01/2022. No cord signal changes. Severe right foraminal narrowing at C3-4 with impingement on the exiting C4 nerve root. No canal or foraminal stenosis C4-C7. Large anterior flowing exostosis C3-7 consistent with DISH.   A/P: Summary: [name] is a very pleasant 72 year old gentleman with progressive debilitating neck pain. Clinically he does complain of dysesthesias into the left upper extremity but based on his MRI there is no significant neural compression. I do believe the fusion can lead to increased inflammation causing nerve irritation and the resulting radicular arm pain. In addition the severe foraminal stenosis at C3-4 with resulting C4 nerve irritation could explain a portion of his significant right sided neck and scapular pain.  At this point we have had a long conversation about treatment options. I have reviewed his imaging studies with his wife present and all of their questions were addressed. He is expressed a desire to move forward with surgery which I think is reasonable. Given the severity of the foraminal stenosis at C3-4 I am recommending an ACDF at this level. I would then remove the large flowing anterior exostosis in order to improve his range of motion. Postoperatively we will follow this up with a radiation dose to help prevent heterotopic ossification.  I have gone over  the surgical procedure in great detail including the risks and benefits. Risks and benefits of surgery were discussed with the patient. These include: Infection, bleeding, death, stroke, paralysis, ongoing or worse pain, need for additional surgery, nonunion, leak of spinal fluid, adjacent segment degeneration requiring additional fusion surgery. Pseudoarthrosis (nonunion)requiring supplemental posterior fixation. Throat pain, swallowing difficulties, hoarseness or change in voice.

## 2022-07-15 NOTE — Progress Notes (Signed)
Orthopedic Tech Progress Note Patient Details:  [name] 11-11-1950 [id number]  Ortho Devices Type of Ortho Device: Soft collar Ortho Device/Splint Interventions: Ordered     Soft collar dropped off at [facility]. Vernona Rieger 07/15/2022, 2:31 PM

## 2022-07-15 NOTE — Discharge Instructions (Signed)

## 2022-07-15 NOTE — Transfer of Care (Signed)
Immediate Anesthesia Transfer of Care Note  Patient: [name]  Procedure(s) Performed: ANTERIOR CERVICAL DECOMPRESSION/DISCECTOMY FUSION 1 LEVELC3-4, REMOVAL OF EXOSTOSIS C4-7 (Spine Cervical)  Patient Location: PACU  Anesthesia Type:General  Level of Consciousness: awake and patient cooperative  Airway & Oxygen Therapy: Patient Spontanous Breathing and Patient connected to face mask oxygen  Post-op Assessment: Report given to RN, Post -op Vital signs reviewed and stable, and Patient moving all extremities  Post vital signs: Reviewed and stable  Last Vitals:  Vitals Value Taken Time  BP 155/85 07/15/22 1445  Temp    Pulse 86 07/15/22 1449  Resp 12 07/15/22 1449  SpO2 96 % 07/15/22 1449  Vitals shown include unvalidated device data.  Last Pain:  Vitals:   07/15/22 0919  TempSrc:   PainSc: 2       Patients Stated Pain Goal: 2 ([id number])  Complications: No notable events documented.

## 2022-07-16 MED FILL — Thrombin For Soln 20000 Unit: CUTANEOUS | Qty: 1 | Status: AC

## 2022-07-16 NOTE — Discharge Summary (Signed)
Patient ID: [name] MRN: [id number] DOB/AGE: February 01, 1951 72 y.o.  Admit date: 07/15/2022 Discharge date: 07/16/2022  Admission Diagnoses:  Principal Problem:   Cervical radiculopathy   Discharge Diagnoses:  Principal Problem:   Cervical radiculopathy  status post Procedure(s): ANTERIOR CERVICAL DECOMPRESSION/DISCECTOMY FUSION 1 LEVELC3-4, REMOVAL OF EXOSTOSIS C4-7  Past Medical History:  Diagnosis Date   Arthritis    Carpal tunnel syndrome of right wrist 12/22/2012   Fibromyalgia    GERD (gastroesophageal reflux disease)    Headache(784.0)    sinus headaches; also dull headache almost every day, states related to fibromyalgia   Hyperlipidemia    Hypertension    under control with meds., has been on med. since 2008   Hypothyroidism    Irritable bowel syndrome    no current med.   Otitis media 12/25/2012   started antibiotic 12/25/2012 x 14 days   Seasonal allergies    Sleep apnea    uses CPAP nightly    Surgeries: Procedure(s): ANTERIOR CERVICAL DECOMPRESSION/DISCECTOMY FUSION 1 LEVELC3-4, REMOVAL OF EXOSTOSIS C4-7 on 07/15/2022   Consultants:   Discharged Condition: Improved  Hospital Course: FALLON SALAIZ is an 72 y.o. male who was admitted 07/15/2022 for operative treatment of Cervical radiculopathy. Patient failed conservative treatments (please see the history and physical for the specifics) and had severe unremitting pain that affects sleep, daily activities and work/hobbies. After pre-op clearance, the patient was taken to the operating room on 07/15/2022 and underwent  Procedure(s): ANTERIOR CERVICAL DECOMPRESSION/DISCECTOMY FUSION 1 LEVELC3-4, REMOVAL OF EXOSTOSIS C4-7.    Patient was given perioperative antibiotics:  Anti-infectives (From admission, onward)    Start     Dose/Rate Route Frequency Ordered Stop   07/15/22 2000  ceFAZolin (ANCEF) IVPB 1 g/50 mL premix        1 g 100 mL/hr over 30 Minutes Intravenous Every 8 hours 07/15/22 1534  07/16/22 0421   07/15/22 1045  ceFAZolin (ANCEF) 2-4 GM/100ML-% IVPB       Note to Pharmacy: Barbie Haggis N: cabinet override      07/15/22 1045 07/15/22 1123   07/15/22 1043  ceFAZolin (ANCEF) IVPB 2g/100 mL premix        2 g 200 mL/hr over 30 Minutes Intravenous 30 min pre-op 07/15/22 1043 07/15/22 1119        Patient was given sequential compression devices and early ambulation to prevent DVT.   Patient benefited maximally from hospital stay and there were no complications. At the time of discharge, the patient was urinating/moving their bowels without difficulty, tolerating a regular diet, pain is controlled with oral pain medications and they have been cleared by PT/OT.   Recent vital signs: Patient Vitals for the past 24 hrs:  BP Temp Temp src Pulse Resp SpO2 Height Weight  07/16/22 0417 129/70 98.1 F (36.7 C) Oral 83 20 98 % -- --  07/15/22 2326 (!) 142/73 98 F (36.7 C) Oral 98 20 93 % -- --  07/15/22 1938 (!) 142/82 98.6 F (37 C) Oral 100 20 96 % -- --  07/15/22 1500 (!) 158/86 -- -- 97 13 94 % -- --  07/15/22 1445 (!) 155/85 97.7 F (36.5 C) -- 89 12 97 % -- --  07/15/22 0843 (!) 155/81 98.3 F (36.8 C) Oral 83 18 99 % '5\' 9"'$  (1.753 m) 104.3 kg     Recent laboratory studies: No results for input(s): "WBC", "HGB", "HCT", "PLT", "NA", "K", "CL", "CO2", "BUN", "CREATININE", "GLUCOSE", "INR", "CALCIUM" in the last 72  hours.  Invalid input(s): "PT", "2"   Discharge Medications:   Allergies as of 07/16/2022       Reactions   Amlodipine Swelling   Can tolerate 2.5 mg    Simvastatin Other (See Comments)   myalgias        Medication List     STOP taking these medications    acetaminophen 500 MG tablet Commonly known as: TYLENOL   ascorbic acid 500 MG tablet Commonly known as: VITAMIN C   ibuprofen 200 MG tablet Commonly known as: ADVIL   multivitamin tablet   NON FORMULARY   OSTEO BI-FLEX TRIPLE STRENGTH PO   tiZANidine 2 MG tablet Commonly known  as: ZANAFLEX   VITAMIN D PO       TAKE these medications    amLODipine 2.5 MG tablet Commonly known as: NORVASC Take 2.5 mg by mouth daily.   Cymbalta 60 MG capsule Generic drug: DULoxetine Take 60 mg by mouth daily.   diphenhydrAMINE 25 MG tablet Commonly known as: BENADRYL Take 75 mg by mouth in the morning, at noon, in the evening, and at bedtime.   fenofibrate 160 MG tablet Take 1 tablet (160 mg total) by mouth daily. NEED OV. What changed:  how much to take when to take this additional instructions Another medication with the same name was removed. Continue taking this medication, and follow the directions you see here.   fluticasone 50 MCG/ACT nasal spray Commonly known as: FLONASE Place 1 spray into both nostrils in the morning and at bedtime.   gabapentin 600 MG tablet Commonly known as: NEURONTIN Take 600 mg by mouth 3 (three) times daily.   glycopyrrolate 2 MG tablet Commonly known as: ROBINUL Take 2 mg by mouth daily.   hydrochlorothiazide 25 MG tablet Commonly known as: HYDRODIURIL Take 25 mg by mouth daily.   levothyroxine 50 MCG tablet Commonly known as: SYNTHROID Take 50 mcg by mouth daily before breakfast.   methocarbamol 500 MG tablet Commonly known as: ROBAXIN Take 1 tablet (500 mg total) by mouth every 8 (eight) hours as needed for up to 5 days for muscle spasms.   metoprolol tartrate 25 MG tablet Commonly known as: LOPRESSOR take 1/2 tablet by mouth twice a day   omeprazole 40 MG capsule Commonly known as: PRILOSEC Take 40 mg by mouth daily.   ondansetron 4 MG tablet Commonly known as: Zofran Take 1 tablet (4 mg total) by mouth every 8 (eight) hours as needed for nausea or vomiting.   oxyCODONE-acetaminophen 10-325 MG tablet Commonly known as: Percocet Take 1 tablet by mouth every 6 (six) hours as needed for up to 5 days for pain.   rosuvastatin 10 MG tablet Commonly known as: CRESTOR Take 10 mg by mouth daily.   zolpidem 10  MG tablet Commonly known as: AMBIEN Take 10 mg by mouth at bedtime.        Diagnostic Studies: DG Cervical Spine 2 or 3 views  Result Date: 07/15/2022 CLINICAL DATA:  Cervical fusion. EXAM: CERVICAL SPINE - 2-3 VIEW COMPARISON:  None Available. FLUOROSCOPY TIME:  Radiation Exposure Index (as provided by the fluoroscopic device): 17.84 mGy Kerma C-arm fluoroscopic images were obtained intraoperatively and submitted for post operative interpretation. FINDINGS: Multiple intraoperative fluoroscopic images of the cervical spine demonstrate interval C3-C4 ACDF. Hardware appears well position. No acute osseous abnormality. IMPRESSION: 1. Intraoperative fluoroscopic guidance for C3-C4 ACDF. Electronically Signed   By: Titus Dubin M.D.   On: 07/15/2022 15:00   DG C-Arm 1-60 Min-No  Report  Result Date: 07/15/2022 Fluoroscopy was utilized by the requesting physician.  No radiographic interpretation.   DG C-Arm 1-60 Min-No Report  Result Date: 07/15/2022 Fluoroscopy was utilized by the requesting physician.  No radiographic interpretation.    Discharge Instructions     Incentive spirometry RT   Complete by: As directed         Follow-up Information     Melina Schools, MD. Schedule an appointment as soon as possible for a visit in 2 week(s).   Specialty: Orthopedic Surgery Why: If symptoms worsen, For suture removal, For wound re-check Contact information: [address] [place] [address] [phone]                 Discharge Plan:  discharge to home  Disposition: Patient status post removal of flowing anterior exostosis from the cervical spine and ACDF at C3-4.  Overall he is doing exceptionally well.  He is tolerating a regular diet and denies any significant dysphagia.  Incision is clean dry and intact there is no drainage or swelling.  Patient is ambulating and voiding spontaneously.  Plan on discharge to home today.  Instructions and appropriate  medications have been provided.   Signed: Dahlia Bailiff for Dr. Melina Schools [facility] [phone] 07/16/2022, 7:11 AM

## 2022-07-16 NOTE — Progress Notes (Signed)
PT Cancellation Note  Patient Details Name: [name] MRN: [id number] DOB: Jun 23, 1950   Cancelled Treatment:    Reason Eval/Treat Not Completed: PT screened, no needs identified, will sign off Pt is functioning at Mod I level for ADLs and walking and does not require PT evaluation per OT report. Will screen and sign off.    [name] 07/16/2022, 9:07 AM Marisa Severin, PT, DPT Acute Rehabilitation Services Secure chat preferred Office [phone]

## 2022-07-16 NOTE — Progress Notes (Signed)
Patient alert and oriented, mae's well, voiding adequate amount of urine, swallowing without difficulty, no c/o pain at time of discharge. Patient discharged home with family. Script and discharged instructions given to patient. Patient and family stated understanding of instructions given. Patient has an appointment with Dr. Rolena Infante in 2 weeks

## 2022-07-16 NOTE — Evaluation (Signed)
Occupational Therapy Evaluation Patient Details Name: [name] MRN: [id number] DOB: 1950/07/28 Today's Date: 07/16/2022   History of Present Illness Patient is a 72 y/o male who presents on 2/22 for C3-4 ACDF with removal of anterior exostosis C4-7. PMH includes HTN and sleep apnea.   Clinical Impression   Prior to this admission, patient living with his wife, retired, and driving. Currently, patient presenting status post surgery feeling much better than he did pre-surgery with improved grip strength bilaterally and swallowing. Patient is at a mod I level for ADLs, transfers, and mobility. Cervical precaution handout provided with patient knowledgeable and adhering to precautions throughout. No OT needed after discharge; OT signing off.      Recommendations for follow up therapy are one component of a multi-disciplinary discharge planning process, led by the attending physician.  Recommendations may be updated based on patient status, additional functional criteria and insurance authorization.   Follow Up Recommendations  No OT follow up     Assistance Recommended at Discharge PRN  Patient can return home with the following Assist for transportation (initially)    Functional Status Assessment  Patient has had a recent decline in their functional status and demonstrates the ability to make significant improvements in function in a reasonable and predictable amount of time.  Equipment Recommendations  None recommended by OT    Recommendations for Other Services       Precautions / Restrictions Precautions Precautions: Cervical Precaution Booklet Issued: Yes (comment) Required Braces or Orthoses: Cervical Brace Cervical Brace: Hard collar Restrictions Weight Bearing Restrictions: No      Mobility Bed Mobility               General bed mobility comments: up in chair upon arrival    Transfers Overall transfer level: Independent Equipment used: None                General transfer comment: able to complete functional mobility and full flight of stairs without issue      Balance Overall balance assessment: Modified Independent                                         ADL either performed or assessed with clinical judgement   ADL Overall ADL's : At baseline                                       General ADL Comments: Patient dressed and ready for discharge on OT entry, citing no difficulty and able to maintain cervical precautions     Vision Baseline Vision/History: 1 Wears glasses Ability to See in Adequate Light: 0 Adequate Patient Visual Report: No change from baseline       Perception     Praxis      Pertinent Vitals/Pain Pain Assessment Pain Assessment: No/denies pain     Hand Dominance     Extremity/Trunk Assessment Upper Extremity Assessment Upper Extremity Assessment: Overall WFL for tasks assessed   Lower Extremity Assessment Lower Extremity Assessment: Overall WFL for tasks assessed   Cervical / Trunk Assessment Cervical / Trunk Assessment: Neck Surgery   Communication Communication Communication: No difficulties   Cognition Arousal/Alertness: Awake/alert Behavior During Therapy: WFL for tasks assessed/performed Overall Cognitive Status: Within Functional Limits for tasks assessed  General Comments       Exercises     Shoulder Instructions      Home Living Family/patient expects to be discharged to:: Private residence Living Arrangements: Spouse/significant other Available Help at Discharge: Family;Available 24 hours/day Type of Home: House Home Access: Stairs to enter [employer] of Steps: 4 Entrance Stairs-Rails: None Home Layout: Two level Alternate Level Stairs-Number of Steps: 13 Alternate Level Stairs-Rails: Right Bathroom Shower/Tub: Teacher, early years/pre: Standard      Home Equipment: None          Prior Functioning/Environment Prior Level of Function : Independent/Modified Independent;Driving             Mobility Comments: independent ADLs Comments: independent        OT Problem List: Decreased activity tolerance      OT Treatment/Interventions:      OT Goals(Current goals can be found in the care plan section) Acute Rehab OT Goals Patient Stated Goal: to get back to fixing my cars and boats OT Goal Formulation: With patient Time For Goal Achievement: 07/30/22 Potential to Achieve Goals: Good  OT Frequency:      Co-evaluation              AM-PAC OT "6 Clicks" Daily Activity     Outcome Measure Help from another person eating meals?: None Help from another person taking care of personal grooming?: None Help from another person toileting, which includes using toliet, bedpan, or urinal?: None Help from another person bathing (including washing, rinsing, drying)?: None Help from another person to put on and taking off regular upper body clothing?: None Help from another person to put on and taking off regular lower body clothing?: None 6 Click Score: 24   End of Session Nurse Communication: Mobility status  Activity Tolerance: Patient tolerated treatment well Patient left: in chair;with call bell/phone within reach  OT Visit Diagnosis: Unsteadiness on feet (R26.81)                Time: [id number] OT Time Calculation (min): 12 min Charges:  OT General Charges $OT Visit: 1 Visit OT Evaluation $OT Eval Moderate Complexity: 1 Mod  [name], OTR/L Acute Rehabilitation Services [phone]   [name] 07/16/2022, 8:42 AM

## 2022-07-19 ENCOUNTER — Encounter (HOSPITAL_COMMUNITY): Payer: Self-pay | Admitting: Orthopedic Surgery

## 2022-07-23 ENCOUNTER — Encounter (HOSPITAL_COMMUNITY): Payer: Self-pay | Admitting: Orthopedic Surgery

## 2022-08-27 DIAGNOSIS — M542 Cervicalgia: Secondary | ICD-10-CM | POA: Diagnosis not present

## 2022-08-31 DIAGNOSIS — Z4889 Encounter for other specified surgical aftercare: Secondary | ICD-10-CM | POA: Diagnosis not present

## 2022-09-06 NOTE — Progress Notes (Signed)
Radiation Oncology         [phone] ________________________________  Name: [name]        MRN: [id number]  Date of Service: 09/07/2022 DOB: 12-01-1950  EX:BMWUXL, Todd Hua, MD  Venita Lick, MD     REFERRING PHYSICIAN: Venita Lick, MD   DIAGNOSIS: There were no encounter diagnoses.   HISTORY OF PRESENT ILLNESS: [name] is a 72 y.o. male seen at the request of Dr. Shon Baton in orthopedic medicine to consider postoperative radiotherapy to reduce risks of heterotopic ossification.  The patient has had ongoing symptoms with neck pain and was being evaluated over time for this. Imaging showed conscerns for foraminal stenosis between C3-4 and associated symptoms of radiculopathy. He  underwent an anterior cervical decompression/discectomy fusion from C3-C4 as well as excision of exostosis between C4-C7 on 07/15/22.  Dr. Shon Baton had reached out last week about the possibility of adjuvant radiotherapy to try and reduce risks of recurrence of the exostosis.   PREVIOUS RADIATION THERAPY: No   PAST MEDICAL HISTORY:  Past Medical History:  Diagnosis Date   Arthritis    Carpal tunnel syndrome of right wrist 12/22/2012   Fibromyalgia    GERD (gastroesophageal reflux disease)    Headache(784.0)    sinus headaches; also dull headache almost every day, states related to fibromyalgia   Hyperlipidemia    Hypertension    under control with meds., has been on med. since 2008   Hypothyroidism    Irritable bowel syndrome    no current med.   Otitis media 12/25/2012   started antibiotic 12/25/2012 x 14 days   Seasonal allergies    Sleep apnea    uses CPAP nightly       PAST SURGICAL HISTORY: Past Surgical History:  Procedure Laterality Date   ANTERIOR CERVICAL DECOMP/DISCECTOMY FUSION N/A 07/15/2022   Procedure: ANTERIOR CERVICAL DECOMPRESSION/DISCECTOMY FUSION 1 LEVELC3-4, REMOVAL OF EXOSTOSIS C4-7;  Surgeon: Venita Lick, MD;  Location: [facility] OR;  Service: Orthopedics;   Laterality: N/A;   CARDIAC CATHETERIZATION  2009   CARPAL TUNNEL RELEASE Left 11/28/2012   Procedure: CARPAL TUNNEL RELEASE;  Surgeon: Wyn Forster., MD;  Location: [facility];  Service: Orthopedics;  Laterality: Left;   CARPAL TUNNEL RELEASE Right 01/02/2013   Procedure: CARPAL TUNNEL RELEASE;  Surgeon: Wyn Forster., MD;  Location: [facility];  Service: Orthopedics;  Laterality: Right;   CHOLECYSTECTOMY     COLONOSCOPY  2014   KNEE ARTHROSCOPY Bilateral    NASAL SEPTUM SURGERY     VASECTOMY       FAMILY HISTORY:  Family History  Problem Relation Age of Onset   Bladder Cancer Mother        bladder   Hypertension Mother    Heart disease Father        MI - CABG, CHF, DM, arrhythmia   Hypertension Sister        high cholesterol    Heart disease Sister        HTN, high cholesterol   Heart attack Brother        half brother - CABG, CHF   Hypertension Daughter    Hyperlipidemia Daughter      SOCIAL HISTORY:  reports that he has quit smoking. His smoking use included cigarettes. He has never used smokeless tobacco. He reports that he does not drink alcohol and does not use drugs.   ALLERGIES: Amlodipine and Simvastatin   MEDICATIONS:  Current Outpatient Medications  Medication Sig Dispense Refill   amLODipine (NORVASC) 2.5 MG tablet Take 2.5 mg by mouth daily.      CYMBALTA 60 MG capsule Take 60 mg by mouth daily.      diphenhydrAMINE (BENADRYL) 25 MG tablet Take 75 mg by mouth in the morning, at noon, in the evening, and at bedtime.     fenofibrate 160 MG tablet Take 1 tablet (160 mg total) by mouth daily. NEED OV. (Patient taking differently: Take 80 mg by mouth in the morning and at bedtime.) 30 tablet 0   fluticasone (FLONASE) 50 MCG/ACT nasal spray Place 1 spray into both nostrils in the morning and at bedtime.     gabapentin (NEURONTIN) 600 MG tablet Take 600 mg by mouth 3 (three) times daily.     glycopyrrolate (ROBINUL) 2 MG  tablet Take 2 mg by mouth daily.     hydrochlorothiazide (HYDRODIURIL) 25 MG tablet Take 25 mg by mouth daily.     levothyroxine (SYNTHROID) 50 MCG tablet Take 50 mcg by mouth daily before breakfast.     metoprolol tartrate (LOPRESSOR) 25 MG tablet take 1/2 tablet by mouth twice a day 30 tablet 6   omeprazole (PRILOSEC) 40 MG capsule Take 40 mg by mouth daily.     ondansetron (ZOFRAN) 4 MG tablet Take 1 tablet (4 mg total) by mouth every 8 (eight) hours as needed for nausea or vomiting. 20 tablet 0   rosuvastatin (CRESTOR) 10 MG tablet Take 10 mg by mouth daily.      zolpidem (AMBIEN) 10 MG tablet Take 10 mg by mouth at bedtime.     No current facility-administered medications for this visit.     REVIEW OF SYSTEMS: On review of systems, the patient reports that ***       PHYSICAL EXAM:  Wt Readings from Last 3 Encounters:  07/15/22 230 lb (104.3 kg)  07/06/22 230 lb (104.3 kg)  10/26/19 243 lb (110.2 kg)   Temp Readings from Last 3 Encounters:  07/16/22 98.3 F (36.8 C) (Oral)  07/06/22 97.9 F (36.6 C)  10/26/19 99.1 F (37.3 C) (Oral)   BP Readings from Last 3 Encounters:  07/16/22 (!) 148/94  07/06/22 139/73  10/26/19 (!) 161/94   Pulse Readings from Last 3 Encounters:  07/16/22 88  07/06/22 74  10/26/19 94    /10  In general this is a well appearing caucasian male in no acute distress. He's alert and oriented x4 and appropriate throughout the examination. Cardiopulmonary assessment is negative for acute distress and he exhibits normal effort.     ECOG = ***  0 - Asymptomatic (Fully active, able to carry on all predisease activities without restriction)  1 - Symptomatic but completely ambulatory (Restricted in physically strenuous activity but ambulatory and able to carry out work of a light or sedentary nature. For example, light housework, office work)  2 - Symptomatic, <50% in bed during the day (Ambulatory and capable of all self care but unable to carry  out any work activities. Up and about more than 50% of waking hours)  3 - Symptomatic, >50% in bed, but not bedbound (Capable of only limited self-care, confined to bed or chair 50% or more of waking hours)  4 - Bedbound (Completely disabled. Cannot carry on any self-care. Totally confined to bed or chair)  5 - Death   Santiago Glad MM, Creech RH, Tormey DC, et al. [phone]). "Toxicity and response criteria of the [facility]". Am. Evlyn Clines. Oncol. 5 (  6): 649-55    LABORATORY DATA:  Lab Results  Component Value Date   WBC 4.6 07/06/2022   HGB 13.4 07/06/2022   HCT 40.0 07/06/2022   MCV 90.5 07/06/2022   PLT 248 07/06/2022   Lab Results  Component Value Date   NA 137 07/06/2022   K 4.2 07/06/2022   CL 98 07/06/2022   CO2 30 07/06/2022   Lab Results  Component Value Date   ALT 42 10/26/2019   AST 35 10/26/2019   ALKPHOS 82 10/26/2019   BILITOT 0.6 10/26/2019      RADIOGRAPHY: No results found.     IMPRESSION/PLAN: 1. Exostosis of the cervical spine s/p cervical decompression/discectomy/fusion and excision of the exostosis of the cervical spine levels C4-7. Dr. Mitzi Hansen discusses the   Dr. Mitzi Hansen discusses the delivery and logistics of radiotherapy and anticipates a course of *** weeks of radiotherapy.     In a visit lasting *** minutes, greater than 50% of the time was spent face to face discussing the patient's condition, in preparation for the discussion, and coordinating the patient's care.   The above documentation reflects my direct findings during this shared patient visit. Please see the separate note by Dr. Mitzi Hansen on this date for the remainder of the patient's plan of care.    Osker Mason, [facility]   **Disclaimer: This note was dictated with voice recognition software. Similar sounding words can inadvertently be transcribed and this note may contain transcription errors which may not have been corrected upon publication of note.**

## 2022-09-06 NOTE — Progress Notes (Signed)
Location of Primary Concern: C3-4  Patient presented to orthopedic care with concerns of ongoing neck pain.   Past/Anticipated interventions by Surgery, if any: Dr. Shon Baton -Anterior Cervical Decompression/Discectomy Fusion C3-4, removal of exostosis C4-7 07/15/2022  Denies changes with ROM, pain minimal.    SAFETY ISSUES: Prior radiation? No Pacemaker/ICD? No Possible current pregnancy? N/a Is the patient on methotrexate? No  Current Complaints / other details:

## 2022-09-07 ENCOUNTER — Other Ambulatory Visit: Payer: Self-pay

## 2022-09-07 ENCOUNTER — Ambulatory Visit
Admission: RE | Admit: 2022-09-07 | Discharge: 2022-09-07 | Disposition: A | Discharge status: Home / Self Care | Payer: Medicare HMO | Source: Ambulatory Visit | Attending: Radiation Oncology | Admitting: Radiation Oncology

## 2022-09-07 ENCOUNTER — Encounter: Payer: Self-pay | Admitting: Radiation Oncology

## 2022-09-07 VITALS — BP 152/80 | HR 79 | Temp 98.0°F | Resp 20 | Ht 69.0 in | Wt 231.0 lb

## 2022-09-07 VITALS — BP 152/80 | HR 79 | Temp 98.0°F | Resp 20 | Wt 231.0 lb

## 2022-09-07 DIAGNOSIS — M898X9 Other specified disorders of bone, unspecified site: Secondary | ICD-10-CM | POA: Diagnosis not present

## 2022-09-07 DIAGNOSIS — Z8052 Family history of malignant neoplasm of bladder: Secondary | ICD-10-CM | POA: Insufficient documentation

## 2022-09-07 DIAGNOSIS — Z79890 Hormone replacement therapy (postmenopausal): Secondary | ICD-10-CM | POA: Diagnosis not present

## 2022-09-07 DIAGNOSIS — Z79899 Other long term (current) drug therapy: Secondary | ICD-10-CM | POA: Diagnosis not present

## 2022-09-07 DIAGNOSIS — M5412 Radiculopathy, cervical region: Secondary | ICD-10-CM | POA: Insufficient documentation

## 2022-09-07 DIAGNOSIS — E039 Hypothyroidism, unspecified: Secondary | ICD-10-CM | POA: Diagnosis not present

## 2022-09-07 DIAGNOSIS — E785 Hyperlipidemia, unspecified: Secondary | ICD-10-CM | POA: Diagnosis not present

## 2022-09-07 DIAGNOSIS — G4730 Sleep apnea, unspecified: Secondary | ICD-10-CM | POA: Insufficient documentation

## 2022-09-07 DIAGNOSIS — K219 Gastro-esophageal reflux disease without esophagitis: Secondary | ICD-10-CM | POA: Insufficient documentation

## 2022-09-07 DIAGNOSIS — M797 Fibromyalgia: Secondary | ICD-10-CM | POA: Insufficient documentation

## 2022-09-07 DIAGNOSIS — I10 Essential (primary) hypertension: Secondary | ICD-10-CM | POA: Diagnosis not present

## 2022-09-07 DIAGNOSIS — Z87891 Personal history of nicotine dependence: Secondary | ICD-10-CM | POA: Diagnosis not present

## 2022-09-08 DIAGNOSIS — M898X9 Other specified disorders of bone, unspecified site: Secondary | ICD-10-CM | POA: Diagnosis not present

## 2022-09-13 ENCOUNTER — Other Ambulatory Visit: Payer: Self-pay

## 2022-09-13 ENCOUNTER — Ambulatory Visit
Admission: RE | Admit: 2022-09-13 | Discharge: 2022-09-13 | Disposition: A | Discharge status: Home / Self Care | Payer: Medicare HMO | Source: Ambulatory Visit | Attending: Radiation Oncology | Admitting: Radiation Oncology

## 2022-09-13 DIAGNOSIS — M898X9 Other specified disorders of bone, unspecified site: Secondary | ICD-10-CM | POA: Insufficient documentation

## 2022-09-13 DIAGNOSIS — Z510 Encounter for antineoplastic radiation therapy: Secondary | ICD-10-CM | POA: Insufficient documentation

## 2022-09-18 DIAGNOSIS — M898X9 Other specified disorders of bone, unspecified site: Secondary | ICD-10-CM | POA: Diagnosis not present

## 2022-09-18 DIAGNOSIS — Z510 Encounter for antineoplastic radiation therapy: Secondary | ICD-10-CM | POA: Diagnosis not present

## 2022-09-22 ENCOUNTER — Other Ambulatory Visit: Payer: Self-pay

## 2022-09-22 ENCOUNTER — Ambulatory Visit
Admission: RE | Admit: 2022-09-22 | Discharge: 2022-09-22 | Disposition: A | Discharge status: Home / Self Care | Payer: Medicare HMO | Source: Ambulatory Visit | Attending: Radiation Oncology | Admitting: Radiation Oncology

## 2022-09-22 DIAGNOSIS — M898X9 Other specified disorders of bone, unspecified site: Secondary | ICD-10-CM | POA: Insufficient documentation

## 2022-09-22 DIAGNOSIS — Z510 Encounter for antineoplastic radiation therapy: Secondary | ICD-10-CM | POA: Insufficient documentation

## 2022-09-22 LAB — RAD ONC ARIA SESSION SUMMARY
Course Elapsed Days: 0
Plan Fractions Treated to Date: 1
Plan Prescribed Dose Per Fraction: 4 Gy
Plan Total Fractions Prescribed: 3
Plan Total Prescribed Dose: 12 Gy
Reference Point Dosage Given to Date: 4 Gy
Reference Point Session Dosage Given: 4 Gy
Session Number: 1

## 2022-09-23 ENCOUNTER — Ambulatory Visit
Admission: RE | Admit: 2022-09-23 | Discharge: 2022-09-23 | Disposition: A | Discharge status: Home / Self Care | Payer: Medicare HMO | Source: Ambulatory Visit | Attending: Radiation Oncology | Admitting: Radiation Oncology

## 2022-09-23 ENCOUNTER — Other Ambulatory Visit: Payer: Self-pay

## 2022-09-23 DIAGNOSIS — M898X9 Other specified disorders of bone, unspecified site: Secondary | ICD-10-CM | POA: Diagnosis not present

## 2022-09-23 DIAGNOSIS — Z510 Encounter for antineoplastic radiation therapy: Secondary | ICD-10-CM | POA: Diagnosis not present

## 2022-09-23 LAB — RAD ONC ARIA SESSION SUMMARY
Course Elapsed Days: 1
Plan Fractions Treated to Date: 2
Plan Prescribed Dose Per Fraction: 4 Gy
Plan Total Fractions Prescribed: 3
Plan Total Prescribed Dose: 12 Gy
Reference Point Dosage Given to Date: 8 Gy
Reference Point Session Dosage Given: 4 Gy
Session Number: 2

## 2022-09-24 ENCOUNTER — Ambulatory Visit
Admission: RE | Admit: 2022-09-24 | Discharge: 2022-09-24 | Disposition: A | Discharge status: Home / Self Care | Payer: Medicare HMO | Source: Ambulatory Visit | Attending: Radiation Oncology | Admitting: Radiation Oncology

## 2022-09-24 ENCOUNTER — Other Ambulatory Visit: Payer: Self-pay

## 2022-09-24 DIAGNOSIS — Z510 Encounter for antineoplastic radiation therapy: Secondary | ICD-10-CM | POA: Diagnosis not present

## 2022-09-24 DIAGNOSIS — M898X9 Other specified disorders of bone, unspecified site: Secondary | ICD-10-CM | POA: Diagnosis not present

## 2022-09-24 LAB — RAD ONC ARIA SESSION SUMMARY
Course Elapsed Days: 2
Plan Fractions Treated to Date: 3
Plan Prescribed Dose Per Fraction: 4 Gy
Plan Total Fractions Prescribed: 3
Plan Total Prescribed Dose: 12 Gy
Reference Point Dosage Given to Date: 12 Gy
Reference Point Session Dosage Given: 4 Gy
Session Number: 3

## 2022-09-30 NOTE — Radiation Completion Notes (Addendum)
  Radiation Oncology         [phone] ________________________________  Name: KAEDAN DIGGLES MRN: [id number]  Date of Service: 09/24/2022  DOB: 04-18-1951  End of Treatment Note   Diagnosis: Exostosis of the cervical spine s/p cervical decompression/discectomy/fusion and excision of the exostosis of the cervical spine levels C4-7 with risks of future regrowth of the exostosis.  Intent: Curative     ==========DELIVERED PLANS==========  First Treatment Date: 2022-09-22 - Last Treatment Date: 2022-09-24   Plan Name: Spine_C Site: Cervical Spine Technique: 3D Mode: Photon Dose Per Fraction: 4 Gy Prescribed Dose (Delivered / Prescribed): 12 Gy / 12 Gy Prescribed Fxs (Delivered / Prescribed): 3 / 3     ==========ON TREATMENT VISIT DATES========== 2022-09-24  See weekly On Treatment Notes is Epic for details. The patient tolerated radiation. He   will receive a call in about one month from the radiation oncology department. He will continue follow up with Dr. Shon Baton in orthopedics as well.      Osker Mason, PAC

## 2022-10-12 DIAGNOSIS — Z4889 Encounter for other specified surgical aftercare: Secondary | ICD-10-CM | POA: Diagnosis not present

## 2022-11-09 DIAGNOSIS — M25561 Pain in right knee: Secondary | ICD-10-CM | POA: Diagnosis not present

## 2022-11-09 DIAGNOSIS — E1122 Type 2 diabetes mellitus with diabetic chronic kidney disease: Secondary | ICD-10-CM | POA: Diagnosis not present

## 2022-11-09 DIAGNOSIS — N1831 Chronic kidney disease, stage 3a: Secondary | ICD-10-CM | POA: Diagnosis not present

## 2022-11-09 DIAGNOSIS — Z0000 Encounter for general adult medical examination without abnormal findings: Secondary | ICD-10-CM | POA: Diagnosis not present

## 2022-11-09 DIAGNOSIS — I10 Essential (primary) hypertension: Secondary | ICD-10-CM | POA: Diagnosis not present

## 2022-11-09 DIAGNOSIS — E559 Vitamin D deficiency, unspecified: Secondary | ICD-10-CM | POA: Diagnosis not present

## 2022-11-09 DIAGNOSIS — E1169 Type 2 diabetes mellitus with other specified complication: Secondary | ICD-10-CM | POA: Diagnosis not present

## 2022-11-09 DIAGNOSIS — E039 Hypothyroidism, unspecified: Secondary | ICD-10-CM | POA: Diagnosis not present

## 2022-11-09 DIAGNOSIS — Z1331 Encounter for screening for depression: Secondary | ICD-10-CM | POA: Diagnosis not present

## 2022-11-09 DIAGNOSIS — J309 Allergic rhinitis, unspecified: Secondary | ICD-10-CM | POA: Diagnosis not present

## 2022-11-09 DIAGNOSIS — Z125 Encounter for screening for malignant neoplasm of prostate: Secondary | ICD-10-CM | POA: Diagnosis not present

## 2022-11-09 DIAGNOSIS — Z23 Encounter for immunization: Secondary | ICD-10-CM | POA: Diagnosis not present

## 2022-11-09 DIAGNOSIS — E782 Mixed hyperlipidemia: Secondary | ICD-10-CM | POA: Diagnosis not present

## 2022-11-23 NOTE — Progress Notes (Signed)
  Radiation Oncology         [phone] ________________________________  Name: [name] MRN: [id number]  Date of Service: 11/29/2022  DOB: Jan 09, 1951  Post Treatment Telephone Note  Diagnosis:  Exostosis of the cervical spine s/p cervical decompression/discectomy/fusion and excision of the exostosis of the cervical spine levels C4-7 with risks of future regrowth of the exostosis. (as documented in provider EOT note)   The patient was not available for call today. Voicemail left.  The patient is scheduled for ongoing care with Dr. Shon Baton in ortho. The patient was encouraged to call if he develops concerns or questions regarding radiation.   Ruel Favors, LPN

## 2022-11-24 DIAGNOSIS — M1711 Unilateral primary osteoarthritis, right knee: Secondary | ICD-10-CM | POA: Diagnosis not present

## 2022-11-29 ENCOUNTER — Ambulatory Visit
Admission: RE | Admit: 2022-11-29 | Discharge: 2022-11-29 | Disposition: A | Discharge status: Home / Self Care | Payer: Medicare HMO | Source: Ambulatory Visit | Attending: Radiation Oncology | Admitting: Radiation Oncology

## 2022-12-22 DIAGNOSIS — E039 Hypothyroidism, unspecified: Secondary | ICD-10-CM | POA: Diagnosis not present

## 2023-02-03 DIAGNOSIS — E039 Hypothyroidism, unspecified: Secondary | ICD-10-CM | POA: Diagnosis not present

## 2023-04-25 DIAGNOSIS — Z23 Encounter for immunization: Secondary | ICD-10-CM | POA: Diagnosis not present

## 2023-04-25 DIAGNOSIS — E1122 Type 2 diabetes mellitus with diabetic chronic kidney disease: Secondary | ICD-10-CM | POA: Diagnosis not present

## 2023-04-25 DIAGNOSIS — G4700 Insomnia, unspecified: Secondary | ICD-10-CM | POA: Diagnosis not present

## 2023-04-25 DIAGNOSIS — I10 Essential (primary) hypertension: Secondary | ICD-10-CM | POA: Diagnosis not present

## 2023-04-25 DIAGNOSIS — K219 Gastro-esophageal reflux disease without esophagitis: Secondary | ICD-10-CM | POA: Diagnosis not present

## 2023-04-25 DIAGNOSIS — E039 Hypothyroidism, unspecified: Secondary | ICD-10-CM | POA: Diagnosis not present

## 2023-04-25 DIAGNOSIS — E1169 Type 2 diabetes mellitus with other specified complication: Secondary | ICD-10-CM | POA: Diagnosis not present

## 2023-04-25 DIAGNOSIS — M797 Fibromyalgia: Secondary | ICD-10-CM | POA: Diagnosis not present

## 2023-04-25 DIAGNOSIS — M5412 Radiculopathy, cervical region: Secondary | ICD-10-CM | POA: Diagnosis not present

## 2023-04-25 DIAGNOSIS — E782 Mixed hyperlipidemia: Secondary | ICD-10-CM | POA: Diagnosis not present

## 2023-07-12 ENCOUNTER — Other Ambulatory Visit (HOSPITAL_COMMUNITY): Payer: Self-pay | Admitting: Orthopedic Surgery
